# Patient Record
Sex: Female | Born: 1960 | Marital: Married | Smoking: Former smoker
Health system: Southern US, Community
[De-identification: ages and names within clinical notes are randomized; demographics above are authoritative.]

---

## 2018-02-04 ENCOUNTER — Other Ambulatory Visit: Payer: Self-pay | Admitting: Family Medicine

## 2018-02-04 DIAGNOSIS — Z1231 Encounter for screening mammogram for malignant neoplasm of breast: Secondary | ICD-10-CM

## 2018-03-18 ENCOUNTER — Ambulatory Visit: Payer: Self-pay

## 2018-03-31 HISTORY — PX: WISDOM TOOTH EXTRACTION: SHX21

## 2018-06-02 ENCOUNTER — Telehealth: Payer: Self-pay

## 2018-06-02 NOTE — Telephone Encounter (Signed)
Notes on file rj

## 2018-06-04 ENCOUNTER — Ambulatory Visit (INDEPENDENT_AMBULATORY_CARE_PROVIDER_SITE_OTHER): Payer: 59 | Admitting: Cardiology

## 2018-06-04 ENCOUNTER — Encounter: Payer: Self-pay | Admitting: Cardiology

## 2018-06-04 VITALS — BP 120/66 | HR 73 | Ht 63.0 in | Wt 142.0 lb

## 2018-06-04 DIAGNOSIS — R9431 Abnormal electrocardiogram [ECG] [EKG]: Secondary | ICD-10-CM

## 2018-06-04 DIAGNOSIS — R0789 Other chest pain: Secondary | ICD-10-CM | POA: Diagnosis not present

## 2018-06-04 DIAGNOSIS — Z87891 Personal history of nicotine dependence: Secondary | ICD-10-CM

## 2018-06-04 DIAGNOSIS — R079 Chest pain, unspecified: Secondary | ICD-10-CM

## 2018-06-04 NOTE — Progress Notes (Signed)
Cardiology Office Note:    Date:  06/04/2018   ID:  Laurie Atkins, DOB [birth year], MRN [id number]  PCP:  [facility]  Cardiologist:  Garwin Brothers, MD   Referring MD: Richmond Campbell., PA-C    ASSESSMENT:    1. Abnormal electrocardiogram (ECG) (EKG)   2. Chest discomfort   3. Ex-smoker    PLAN:    In order of problems listed above:  1. Primary prevention stressed with the patient.  Importance of compliance with diet and medication stressed and she vocalized understanding.  Her blood pressure is stable.  Diet was discussed.  She is planning to undergo abdominal surgery also in view of multiple risk factors and sedentary lifestyle she will undergo exercise stress echo.  If this is negative then she is not at high risk for coronary events during the aforementioned surgery.  Meticulous hemodynamic monitoring will further reduce the risk of coronary events. 2. Patient will be seen in follow-up appointment in 4 months or earlier if the patient has any concerns    Medication Adjustments/Labs and Tests Ordered: Current medicines are reviewed at length with the patient today.  Concerns regarding medicines are outlined above.  No orders of the defined types were placed in this encounter.  No orders of the defined types were placed in this encounter.    History of Present Illness:    Laurie Atkins is a 58 y.o. female who is being seen today for the evaluation of abnormal EKG and chest discomfort at the request of Richmond Campbell., PA-C.  Patient is a pleasant 58 year old female.  She has no significant past medical history.  She tells me that the last time she had blood work her sugars were mildly elevated and she was told that it could be taken care of with diet.  She has smoked significantly but quit a few years ago.  She was found to have abnormal EKG and sent here for evaluation.  She occasionally has chest discomfort this is more of a knifelike  sensation.  No radiation to the neck or to the arms.  No orthopnea or PND.  She does not exercise on a regular basis and leads a sedentary lifestyle.  At the time of my evaluation, the patient is alert awake oriented and in no distress.  History reviewed. No pertinent past medical history.  Past Surgical History:  Procedure Laterality Date  . WISDOM TOOTH EXTRACTION  2020    Current Medications: Current Meds  Medication Sig  . diphenhydrAMINE (BENADRYL) 25 MG tablet Take 25 mg by mouth every 6 (six) hours as needed.     Allergies:   Patient has no allergy information on record.   Social History   Socioeconomic History  . Marital status: Unknown    Spouse name: Not on file  . Number of children: Not on file  . Years of education: Not on file  . Highest education level: Not on file  Occupational History  . Not on file  Social Needs  . Financial resource strain: Not on file  . Food insecurity:    Worry: Not on file    Inability: Not on file  . Transportation needs:    Medical: Not on file    Non-medical: Not on file  Tobacco Use  . Smoking status: Former Games developer  . Smokeless tobacco: Never Used  Substance and Sexual Activity  . Alcohol use: Not on file  . Drug use: Not on file  . Sexual  activity: Not on file  Lifestyle  . Physical activity:    Days per week: Not on file    Minutes per session: Not on file  . Stress: Not on file  Relationships  . Social connections:    Talks on phone: Not on file    Gets together: Not on file    Attends religious service: Not on file    Active member of club or organization: Not on file    Attends meetings of clubs or organizations: Not on file    Relationship status: Not on file  Other Topics Concern  . Not on file  Social History Narrative  . Not on file     Family History: The patient's family history includes Arrhythmia in her mother; Heart Problems in her father.  ROS:   Please see the history of present illness.      All other systems reviewed and are negative.  EKGs/Labs/Other Studies Reviewed:    The following studies were reviewed today: I discussed my findings with the EKG revealing sinus rhythm with nonspecific ST-T changes. LAE.   Recent Labs: No results found for requested labs within last 8760 hours.  Recent Lipid Panel No results found for: CHOL, TRIG, HDL, CHOLHDL, VLDL, LDLCALC, LDLDIRECT  Physical Exam:    VS:  BP 120/66 (BP Location: Left Arm, Patient Position: Sitting, Cuff Size: Normal)   Pulse 73   Ht 5\' 3"  (1.6 m)   Wt 142 lb (64.4 kg)   SpO2 98%   BMI 25.15 kg/m     Wt Readings from Last 3 Encounters:  06/04/18 142 lb (64.4 kg)     GEN: Patient is in no acute distress HEENT: Normal NECK: No JVD; No carotid bruits LYMPHATICS: No lymphadenopathy CARDIAC: S1 S2 regular, 2/6 systolic murmur at the apex. RESPIRATORY:  Clear to auscultation without rales, wheezing or rhonchi  ABDOMEN: Soft, non-tender, non-distended MUSCULOSKELETAL:  No edema; No deformity  SKIN: Warm and dry NEUROLOGIC:  Alert and oriented x 3 PSYCHIATRIC:  Normal affect    Signed, Garwin Brothers, MD  06/04/2018 9:59 AM    [facility]

## 2018-06-04 NOTE — Patient Instructions (Addendum)
Medication Instructions:  Your physician recommends that you continue on your current medications as directed. Please refer to the Current Medication list given to you today.  If you need a refill on your cardiac medications before your next appointment, please call your pharmacy.   Lab work: None. If you have labs (blood work) drawn today and your tests are completely normal, you will receive your results only by: Marland Kitchen MyChart Message (if you have MyChart) OR . A paper copy in the mail If you have any lab test that is abnormal or we need to change your treatment, we will call you to review the results.  Testing/Procedures: Your physician has requested that you have a stress echocardiogram. For further information please visit [email or web address] Please follow instruction sheet as given.     Follow-Up: At [facility], you and your health needs are our priority.  As part of our continuing mission to provide you with exceptional heart care, we have created designated Provider Care Teams.  These Care Teams include your primary Cardiologist (physician) and Advanced Practice Providers (APPs -  Physician Assistants and Nurse Practitioners) who all work together to provide you with the care you need, when you need it. You will need a follow up appointment in 4 months.  Please call our office 2 months in advance to schedule this appointment.  You may see No primary care provider on file. or another member of our [employer] in [place]: Gypsy Balsam, MD . Norman Herrlich, MD  Any Other Special Instructions Will Be Listed Below (If Applicable).    Exercise Stress Echocardiogram  An exercise stress echocardiogram is a test to check how well your heart is working. This test uses sound waves (ultrasound) and a computer to make images of your heart before and after exercise. Ultrasound images that are taken before you exercise (your resting echocardiogram) will show how much  blood is getting to your heart muscle and how well your heart muscle and heart valves are functioning. During the next part of this test, you will walk on a treadmill or ride a stationary bike to see how exercise affects your heart. While you exercise, the electrical activity of your heart will be monitored with an electrocardiogram (ECG). Your blood pressure will also be monitored. You may have this test if you:  Have chest pain or other symptoms of a heart problem.  Recently had a heart attack or heart surgery.  Have heart valve problems.  Have a condition that causes narrowing of the blood vessels that supply your heart (coronary artery disease).  Have a high risk of heart disease and are starting a new exercise program.  Have a high risk of heart disease and need to have major surgery. Tell a health care provider about:  Any allergies you have.  All medicines you are taking, including vitamins, herbs, eye drops, creams, and over-the-counter medicines.  Any problems you or family members have had with anesthetic medicines.  Any blood disorders you have.  Any surgeries you have had.  Any medical conditions you have.  Whether you are pregnant or may be pregnant. What are the risks? Generally, this is a safe procedure. However, problems may occur, including:  Chest pain.  Dizziness or light-headedness.  Shortness of breath.  Increased or irregular heartbeat (palpitations).  Nausea or vomiting.  Heart attack (very rare). What happens before the procedure?  Follow instructions from your health care provider about eating or drinking restrictions. You may be asked  to avoid all forms of caffeine for 24 hours before your procedure, or as told by your health care provider.  Ask your health care provider about changing or stopping your regular medicines. This is especially important if you are taking diabetes medicines or blood thinners.  If you use an inhaler, bring it with  you to the test.  Wear loose, comfortable clothing and walking shoes.  Do notuse any products that contain nicotine or tobacco, such as cigarettes and e-cigarettes, for 4 hours before the test or as told by your health care provider. If you need help quitting, ask your health care provider. What happens during the procedure?  You will take off your clothes from the waist up and put on a hospital gown.  A technician will place electrodes on your chest.  A blood pressure cuff will be placed on your arm.  You will lie down on a table for an ultrasound exam before you exercise. Gel will be rubbed on your chest, and a handheld device (transducer) will be pressed against your chest and moved over your heart.  Then, you will start exercising by walking on a treadmill or pedaling a stationary bicycle.  Your blood pressure and heart rhythm will be monitored while you exercise.  The exercise will gradually get harder or faster.  You will exercise until: ? Your heart reaches a target level. ? You are too tired to continue. ? You cannot continue because of chest pain, weakness, or dizziness.  You will have another ultrasound exam after you stop exercising. The procedure may vary among health care providers and hospitals. What happens after the procedure?  Your heart rate and blood pressure will be monitored until they return to your normal levels. Summary  An exercise stress echocardiogram is a test that uses ultrasound to check how well your heart works before and after exercise.  Before the test, follow instructions from your health care provider about stopping medications, avoiding nicotine and tobacco, and avoiding certain foods and drinks.  During the test, your blood pressure and heart rhythm will be monitored while you exercise on a treadmill or stationary bicycle. This information is not intended to replace advice given to you by your health care provider. Make sure you discuss any  questions you have with your health care provider. Document Released: 03/21/2004 Document Revised: 11/07/2015 Document Reviewed: 11/07/2015 Elsevier Interactive Patient Education  2019 [employer].

## 2018-06-16 ENCOUNTER — Telehealth: Payer: Self-pay | Admitting: Cardiology

## 2018-06-16 NOTE — Telephone Encounter (Signed)
Patient had to cancel her Echo scheduled for Friday because her out of pocket expense was going to exceed $1000

## 2018-06-18 ENCOUNTER — Ambulatory Visit (HOSPITAL_BASED_OUTPATIENT_CLINIC_OR_DEPARTMENT_OTHER): Payer: 59

## 2018-08-26 ENCOUNTER — Other Ambulatory Visit: Payer: Self-pay

## 2018-08-26 ENCOUNTER — Telehealth: Payer: Self-pay | Admitting: Cardiology

## 2018-08-26 NOTE — Addendum Note (Signed)
Addended by: Pamala Hurry on: 08/26/2018 04:47 PM   Modules accepted: Orders

## 2018-08-26 NOTE — Telephone Encounter (Signed)
yes

## 2018-08-26 NOTE — Telephone Encounter (Signed)
Called patient to discuss lexi procedure. Printed info and forwarded to patient via usps.

## 2018-08-26 NOTE — Telephone Encounter (Signed)
Patient called and wants to have stress echo as soon as she can.. she is nervous and states that she is wanting to have done soone that later and maybe we can schedule Myoview in our office, she is still having some chest pain.. she is nervous about it.Laurie Atkins

## 2018-08-30 ENCOUNTER — Telehealth: Payer: Self-pay

## 2018-08-30 NOTE — Telephone Encounter (Signed)
Patient requested billing code information for lexiscan, code is [phone]. Was unable to leave message on patient cell or home vm.

## 2018-08-31 ENCOUNTER — Telehealth: Payer: Self-pay | Admitting: *Deleted

## 2018-08-31 NOTE — Telephone Encounter (Signed)
Patient given detailed instructions per Myocardial Perfusion Study Information Sheet for the test on 09/02/18. Patient notified to arrive 15 minutes early and that it is imperative to arrive on time for appointment to keep from having the test rescheduled.  If you need to cancel or reschedule your appointment, please call the office within 24 hours of your appointment. . Patient verbalized understanding. Ricky Ala, RN

## 2018-09-02 ENCOUNTER — Ambulatory Visit (INDEPENDENT_AMBULATORY_CARE_PROVIDER_SITE_OTHER): Payer: 59

## 2018-09-02 DIAGNOSIS — R079 Chest pain, unspecified: Secondary | ICD-10-CM

## 2018-09-02 MED ORDER — REGADENOSON 0.4 MG/5ML IV SOLN
0.4000 mg | Freq: Once | INTRAVENOUS | Status: AC
  Administered 2018-09-02: 0.4 mg via INTRAVENOUS

## 2018-09-02 MED ORDER — TECHNETIUM TC 99M TETROFOSMIN IV KIT
32.1000 | PACK | Freq: Once | INTRAVENOUS | Status: AC | PRN
  Administered 2018-09-02: 32.1 via INTRAVENOUS

## 2018-09-02 MED ORDER — TECHNETIUM TC 99M TETROFOSMIN IV KIT
10.2000 | PACK | Freq: Once | INTRAVENOUS | Status: AC | PRN
  Administered 2018-09-02: 10.2 via INTRAVENOUS

## 2018-09-08 LAB — MYOCARDIAL PERFUSION IMAGING
LV dias vol: 105 mL (ref 46–106)
LV sys vol: 48 mL
Peak HR: 111 {beats}/min
Rest HR: 61 {beats}/min
SDS: 4
SRS: 7
SSS: 11
TID: 1.04

## 2018-09-13 ENCOUNTER — Telehealth: Payer: Self-pay

## 2018-09-13 ENCOUNTER — Encounter: Payer: Self-pay | Admitting: Cardiology

## 2018-09-13 ENCOUNTER — Other Ambulatory Visit: Payer: Self-pay

## 2018-09-13 ENCOUNTER — Telehealth (INDEPENDENT_AMBULATORY_CARE_PROVIDER_SITE_OTHER): Payer: 59 | Admitting: Cardiology

## 2018-09-13 VITALS — Ht 63.0 in | Wt 135.4 lb

## 2018-09-13 DIAGNOSIS — I209 Angina pectoris, unspecified: Secondary | ICD-10-CM

## 2018-09-13 DIAGNOSIS — R0789 Other chest pain: Secondary | ICD-10-CM | POA: Diagnosis not present

## 2018-09-13 DIAGNOSIS — Z87891 Personal history of nicotine dependence: Secondary | ICD-10-CM

## 2018-09-13 MED ORDER — METOPROLOL TARTRATE 50 MG PO TABS: 100.0000 mg | ORAL_TABLET | Freq: Two times a day (BID) | ORAL | 0 refills | Status: DC

## 2018-09-13 NOTE — Patient Instructions (Signed)
Medication Instructions:   If you need a refill on your cardiac medications before your next appointment, please call your pharmacy.   Lab work: You will need to come in 7 days prior to your procedure for a BMP to be drawn.  If you have labs (blood work) drawn today and your tests are completely normal, you will receive your results only by:  MyChart Message (if you have MyChart) OR  A paper copy in the mail If you have any lab test that is abnormal or we need to change your treatment, we will call you to review the results.  Testing/Procedures: Non-Cardiac CT scanning, (CAT scanning), is a noninvasive, special x-ray that produces cross-sectional images of the body using x-rays and a computer. CT scans help physicians diagnose and treat medical conditions. For some CT exams, a contrast material is used to enhance visibility in the area of the body being studied. CT scans provide greater clarity and reveal more details than regular x-ray exams.  Please [facility] the [facility] main entrance of [facility] at xx:xx AM (30-45 minutes prior to test start time)  [facility] [address] [place], [place] [address] [phone]  Proceed to the [facility] Radiology Department (First Floor).  Please follow these instructions carefully (unless otherwise directed):  On the Night Before the Test:  Be sure to Drink plenty of water.  Do not consume any caffeinated/decaffeinated beverages or chocolate 12 hours prior to your test.  Do not take any antihistamines 12 hours prior to your test.  On the Day of the Test:  Drink plenty of water. Do not drink any water within one hour of the test.  Do not eat any food 4 hours prior to the test.  You may take your regular medications prior to the test.   Take metoprolol (Lopressor) IF HR is greater than 55 BPM two hours prior to test.      After the Test:  Drink plenty of water.  After receiving IV contrast, you may  experience a mild flushed feeling. This is normal.  On occasion, you may experience a mild rash up to 24 hours after the test. This is not dangerous. If this occurs, you can take Benadryl 25 mg and increase your fluid intake.  If you experience trouble breathing, this can be serious. If it is severe call 911 IMMEDIATELY. If it is mild, please call our office.    Follow-Up: At [facility], you and your health needs are our priority.  As part of our continuing mission to provide you with exceptional heart care, we have created designated Provider Care Teams.  These Care Teams include your primary Cardiologist (physician) and Advanced Practice Providers (APPs -  Physician Assistants and Nurse Practitioners) who all work together to provide you with the care you need, when you need it. You will need a follow up appointment in 4 months.    Any Other Special Instructions Will Be Listed Below  Metoprolol tablets What is this medicine? METOPROLOL (me TOE proe lole) is a beta-blocker. Beta-blockers reduce the workload on the heart and help it to beat more regularly. This medicine is used to treat high blood pressure and to prevent chest pain. It is also used to after a heart attack and to prevent an additional heart attack from occurring. This medicine may be used for other purposes; ask your health care provider or pharmacist if you have questions. COMMON BRAND NAME(S): Lopressor What should I tell  my health care provider before I take this medicine? They need to know if you have any of these conditions: -diabetes -heart or vessel disease like slow heart rate, worsening heart failure, heart block, sick sinus syndrome or Raynaud's disease -kidney disease -liver disease -lung or breathing disease, like asthma or emphysema -pheochromocytoma -thyroid disease -an unusual or allergic reaction to metoprolol, other beta-blockers, medicines, foods, dyes, or preservatives -pregnant or trying to get  pregnant -breast-feeding How should I use this medicine? Take this medicine by mouth with a drink of water. Follow the directions on the prescription label. Take this medicine immediately after meals. Take your doses at regular intervals. Do not take more medicine than directed. Do not stop taking this medicine suddenly. This could lead to serious heart-related effects. Talk to your pediatrician regarding the use of this medicine in children. Special care may be needed. Overdosage: If you think you have taken too much of this medicine contact a poison control center or emergency room at once. NOTE: This medicine is only for you. Do not share this medicine with others. What if I miss a dose? If you miss a dose, take it as soon as you can. If it is almost time for your next dose, take only that dose. Do not take double or extra doses. What may interact with this medicine? This medicine may interact with the following medications: -certain medicines for blood pressure, heart disease, irregular heart beat -certain medicines for depression like monoamine oxidase (MAO) inhibitors, fluoxetine, or paroxetine -clonidine -dobutamine -epinephrine -isoproterenol -reserpine This list may not describe all possible interactions. Give your health care provider a list of all the medicines, herbs, non-prescription drugs, or dietary supplements you use. Also tell them if you smoke, drink alcohol, or use illegal drugs. Some items may interact with your medicine. What should I watch for while using this medicine? Visit your doctor or health care professional for regular check ups. Contact your doctor right away if your symptoms worsen. Check your blood pressure and pulse rate regularly. Ask your health care professional what your blood pressure and pulse rate should be, and when you should contact them. You may get drowsy or dizzy. Do not drive, use machinery, or do anything that needs mental alertness until you  know how this medicine affects you. Do not sit or stand up quickly, especially if you are an older patient. This reduces the risk of dizzy or fainting spells. Contact your doctor if these symptoms continue. Alcohol may interfere with the effect of this medicine. Avoid alcoholic drinks. What side effects may I notice from receiving this medicine? Side effects that you should report to your doctor or health care professional as soon as possible: -allergic reactions like skin rash, itching or hives -cold or numb hands or feet -depression -difficulty breathing -faint -fever with sore throat -irregular heartbeat, chest pain -rapid weight gain -swollen legs or ankles Side effects that usually do not require medical attention (report to your doctor or health care professional if they continue or are bothersome): -anxiety or nervousness -change in sex drive or performance -dry skin -headache -nightmares or trouble sleeping -short term memory loss -stomach upset or diarrhea -unusually tired This list may not describe all possible side effects. Call your doctor for medical advice about side effects. You may report side effects to FDA at 1-800-FDA-1088. Where should I keep my medicine? Keep out of the reach of children. Store at room temperature between 15 and 30 degrees C (59 and 86 degrees  F). Throw away any unused medicine after the expiration date. NOTE: This sheet is a summary. It may not cover all possible information. If you have questions about this medicine, talk to your doctor, pharmacist, or health care provider.  2019 Elsevier/Gold Standard (2012-11-19 14:40:36)   Coronary Angiogram A coronary angiogram is an X-ray procedure that is used to examine the arteries in the heart. In this procedure, a dye (contrast dye) is injected through a long, thin tube (catheter). The catheter is inserted through the groin, wrist, or arm. The dye is injected into each artery, then X-rays are taken to  show if there is a blockage in the arteries of the heart. This procedure can also show if you have valve disease or a disease of the aorta, and it can be used to check the overall function of your heart muscle. You may have a coronary angiogram if:  You are having chest pain, or other symptoms of angina, and you are at risk for heart disease.  You have an abnormal electrocardiogram (ECG) or stress test.  You have chest pain and heart failure.  You are having irregular heart rhythms.  You and your health care provider determine that the benefits of the test information outweigh the risks of the procedure. Let your health care provider know about:  Any allergies you have, including allergies to contrast dye.  All medicines you are taking, including vitamins, herbs, eye drops, creams, and over-the-counter medicines.  Any problems you or family members have had with anesthetic medicines.  Any blood disorders you have.  Any surgeries you have had.  History of kidney problems or kidney failure.  Any medical conditions you have.  Whether you are pregnant or may be pregnant. What are the risks? Generally, this is a safe procedure. However, problems may occur, including:  Infection.  Allergic reaction to medicines or dyes that are used.  Bleeding from the access site or other locations.  Kidney injury, especially in people with impaired kidney function.  Stroke (rare).  Heart attack (rare).  Damage to other structures or organs. What happens before the procedure? Staying hydrated Follow instructions from your health care provider about hydration, which may include:  Up to 2 hours before the procedure - you may continue to drink clear liquids, such as water, clear fruit juice, black coffee, and plain tea. Eating and drinking restrictions Follow instructions from your health care provider about eating and drinking, which may include:  8 hours before the procedure - stop  eating heavy meals or foods such as meat, fried foods, or fatty foods.  6 hours before the procedure - stop eating light meals or foods, such as toast or cereal.  2 hours before the procedure - stop drinking clear liquids. General instructions  Ask your health care provider about: ? Changing or stopping your regular medicines. This is especially important if you are taking diabetes medicines or blood thinners. ? Taking medicines such as ibuprofen. These medicines can thin your blood. Do not take these medicines before your procedure if your health care provider instructs you not to, though aspirin may be recommended prior to coronary angiograms.  Plan to have someone take you home from the hospital or clinic.  You may need to have blood tests or X-rays done. What happens during the procedure?  An IV tube will be inserted into one of your veins.  You will be given one or more of the following: ? A medicine to help you relax (sedative). ?  A medicine to numb the area where the catheter will be inserted into an artery (local anesthetic).  To reduce your risk of infection: ? Your health care team will wash or sanitize their hands. ? Your skin will be washed with soap. ? Hair may be removed from the area where the catheter will be inserted.  You will be connected to a continuous ECG monitor.  The catheter will be inserted into an artery. The location may be in your groin, in your wrist, or in the fold of your arm (near your elbow).  A type of X-ray (fluoroscopy) will be used to help guide the catheter to the opening of the blood vessel that is being examined.  A dye will be injected into the catheter, and X-rays will be taken. The dye will help to show where any narrowing or blockages are located in the heart arteries.  Tell your health care provider if you have any chest pain or trouble breathing during the procedure.  If blockages are found, your health care provider may perform  another procedure, such as inserting a coronary stent. The procedure may vary among health care providers and hospitals. What happens after the procedure?  After the procedure, you will need to keep the area still for a few hours, or for as long as told by your health care provider. If the procedure is done through the groin, you will be instructed to not bend and not cross your legs.  The insertion site will be checked frequently.  The pulse in your foot or wrist will be checked frequently.  You may have additional blood tests, X-rays, and a test that records the electrical activity of your heart (ECG).  Do not drive for 24 hours if you were given a sedative. Summary  A coronary angiogram is an X-ray procedure that is used to look into the arteries in the heart.  During the procedure, a dye (contrast dye) is injected through a long, thin tube (catheter). The catheter is inserted through the groin, wrist, or arm.  Tell your health care provider about any allergies you have, including allergies to contrast dye.  After the procedure, you will need to keep the area still for a few hours, or for as long as told by your health care provider. This information is not intended to replace advice given to you by your health care provider. Make sure you discuss any questions you have with your health care provider. Document Released: 09/21/2002 Document Revised: 12/28/2015 Document Reviewed: 12/28/2015 Elsevier Interactive Patient Education  2019 [employer]

## 2018-09-13 NOTE — Progress Notes (Signed)
Virtual Visit via Video Note   This visit type was conducted due to national recommendations for restrictions regarding the COVID-19 Pandemic (e.g. social distancing) in an effort to limit this patient's exposure and mitigate transmission in our community.  Due to her co-morbid illnesses, this patient is at least at moderate risk for complications without adequate follow up.  This format is felt to be most appropriate for this patient at this time.  All issues noted in this document were discussed and addressed.  A limited physical exam was performed with this format.  Please refer to the patient's chart for her consent to telehealth for [facility].   Date:  09/13/2018   ID:  Laurie Atkins, DOB 1960/07/27, MRN [id number]  Patient Location: Home Provider Location: Home  PCP:  [place], [facility]  Cardiologist:  No primary care provider on file.  Electrophysiologist:  None   Evaluation Performed:  Follow-Up Visit  Chief Complaint: Angina pectoris  History of Present Illness:    Laurie SowSharon Boberg is a 58 y.o. female with past medical history of smoking.  The patient mentions to me that she experiences chest tightness at times.  This is not related to exertion.  She is very concerned about it.  She underwent stress testing which revealed evidence of ischemia and was abnormal.  This was a low risk scan with minimal amount of ischemia.  At the time of my evaluation, the patient is alert awake oriented and in no distress.  Patient also mentions to me that she walks about 20 to 25 minutes at times walking her dog.  The patient does not have symptoms concerning for COVID-19 infection (fever, chills, cough, or new shortness of breath).    History reviewed. No pertinent past medical history. Past Surgical History:  Procedure Laterality Date  . WISDOM TOOTH EXTRACTION  2020     Current Meds  Medication Sig  . aspirin EC 81 MG tablet Take 81 mg by mouth daily.  .  diphenhydrAMINE (BENADRYL) 25 MG tablet Take 25 mg by mouth at bedtime.   . Multiple Vitamin (MULTIVITAMIN) capsule Take 1 capsule by mouth daily.     Allergies:   Patient has no known allergies.   Social History   Tobacco Use  . Smoking status: Former Games developermoker  . Smokeless tobacco: Never Used  Substance Use Topics  . Alcohol use: Not on file  . Drug use: Not on file     Family Hx: The patient's family history includes Arrhythmia in her mother; Heart Problems in her father.  ROS:   Please see the history of present illness.    As mentioned above All other systems reviewed and are negative.   Prior CV studies:   The following studies were reviewed today:  Study Highlights   The left ventricular ejection fraction is mildly decreased (45-54%).  Nuclear stress EF: 54%.  Blood pressure demonstrated a normal response to exercise.  There was no ST segment deviation noted during stress.  Defect 1: There is a small defect of mild severity present in the apical septal location.  Findings consistent with ischemia.  This is a low risk study.  Small area of ischemia onvolving apical portion of the septum.       Labs/Other Tests and Data Reviewed:    EKG:  No ECG reviewed.  Recent Labs: No results found for requested labs within last 8760 hours.   Recent Lipid Panel No results found for: CHOL, TRIG, HDL, CHOLHDL, LDLCALC, LDLDIRECT  Wt Readings from Last 3 Encounters:  09/13/18 135 lb 6.4 oz (61.4 kg)  09/02/18 142 lb (64.4 kg)  06/04/18 142 lb (64.4 kg)     Objective:    Vital Signs:  Ht 5\' 3"  (1.6 m)   Wt 135 lb 6.4 oz (61.4 kg)   BMI 23.99 kg/m    VITAL SIGNS:  reviewed  ASSESSMENT & PLAN:    1. Angina pectoris: I discussed my findings with the patient at extensive length.  Sublingual nitroglycerin prescription was sent, its protocol and 911 protocol explained and the patient vocalized understanding questions were answered to the patient's satisfaction.   Her symptoms have mixed features.  In view of risk factors for coronary artery disease I will obtain a CT coronary angiography.  Procedure, benefits and potential risks explained and she vocalized understanding. 2. She chews Nicorette gum and she was advised to discontinue that. 3. She is taking a coated 81 mg aspirin on a daily basis.  I told her to continue this till the results of her tests come back.  She knows to go to the nearest emergency room for any concerning symptoms. 4. Follow-up appointment in a month or earlier if she has any concerns.  COVID-19 Education: The signs and symptoms of COVID-19 were discussed with the patient and how to seek care for testing (follow up with PCP or arrange E-visit).  The importance of social distancing was discussed today.  Time:   Today, I have spent 15 minutes with the patient with telehealth technology discussing the above problems.     Medication Adjustments/Labs and Tests Ordered: Current medicines are reviewed at length with the patient today.  Concerns regarding medicines are outlined above.   Tests Ordered: No orders of the defined types were placed in this encounter.   Medication Changes: No orders of the defined types were placed in this encounter.   Follow Up:  Virtual Visit or In Person in 1 month(s)  Signed, Jenean Lindau, MD  09/13/2018 3:34 PM    [place]

## 2018-09-13 NOTE — Telephone Encounter (Signed)
-----   Message from Jenean Lindau, MD sent at 09/08/2018 11:42 AM EDT ----- Elective appointment.  Virtual is fine.  I can see her next week. Jenean Lindau, MD 09/08/2018 11:42 AM

## 2018-09-13 NOTE — Addendum Note (Signed)
Addended by: Beckey Rutter on: 09/13/2018 04:49 PM   Modules accepted: Orders

## 2018-09-13 NOTE — Telephone Encounter (Signed)
Patient ok with virtual visit scheduled 09/13/18

## 2018-09-23 LAB — BASIC METABOLIC PANEL
BUN/Creatinine Ratio: 20 (ref 9–23)
BUN: 12 mg/dL (ref 6–24)
CO2: 25 mmol/L (ref 20–29)
Calcium: 9.4 mg/dL (ref 8.7–10.2)
Chloride: 102 mmol/L (ref 96–106)
Creatinine, Ser: 0.59 mg/dL (ref 0.57–1.00)
GFR calc Af Amer: 118 mL/min/{1.73_m2} (ref >59)
GFR calc non Af Amer: 102 mL/min/{1.73_m2} (ref >59)
Glucose: 91 mg/dL (ref 65–99)
Potassium: 4.2 mmol/L (ref 3.5–5.2)
Sodium: 141 mmol/L (ref 134–144)

## 2018-09-24 ENCOUNTER — Telehealth: Payer: Self-pay

## 2018-09-24 ENCOUNTER — Telehealth (HOSPITAL_COMMUNITY): Payer: Self-pay | Admitting: Emergency Medicine

## 2018-09-24 NOTE — Telephone Encounter (Signed)
Reaching out to patient to offer assistance regarding upcoming cardiac imaging study; pt verbalizes understanding of appt date/time, parking situation and where to check in, pre-test NPO status and medications ordered, and verified current allergies; name and call back number provided for further questions should they arise Osie Amparo RN Navigator Cardiac Imaging [facility] [phone] office [phone] cell  Pt denies covid symptoms, verbalized understanding of visitor policy. 

## 2018-09-27 ENCOUNTER — Ambulatory Visit (HOSPITAL_COMMUNITY): Payer: 59

## 2018-09-27 ENCOUNTER — Ambulatory Visit (HOSPITAL_COMMUNITY)
Admission: RE | Admit: 2018-09-27 | Discharge: 2018-09-27 | Disposition: A | Discharge status: Home / Self Care | Payer: 59 | Source: Ambulatory Visit | Attending: Cardiology | Admitting: Cardiology

## 2018-09-27 ENCOUNTER — Other Ambulatory Visit: Payer: Self-pay

## 2018-09-27 ENCOUNTER — Encounter (HOSPITAL_COMMUNITY): Payer: Self-pay

## 2018-09-27 DIAGNOSIS — I209 Angina pectoris, unspecified: Secondary | ICD-10-CM | POA: Insufficient documentation

## 2018-09-27 MED ORDER — NITROGLYCERIN 0.4 MG SL SUBL
0.8000 mg | SUBLINGUAL_TABLET | Freq: Once | SUBLINGUAL | Status: AC
  Administered 2018-09-27: 0.8 mg via SUBLINGUAL

## 2018-09-27 MED ORDER — NITROGLYCERIN 0.4 MG SL SUBL
SUBLINGUAL_TABLET | SUBLINGUAL | Status: AC
  Administered 2018-09-27: 0.8 mg via SUBLINGUAL
  Filled 2018-09-27: qty 2

## 2018-09-27 MED ORDER — NITROGLYCERIN 0.4 MG SL SUBL: 0.4000 mg | SUBLINGUAL_TABLET | SUBLINGUAL | Status: DC | PRN

## 2018-09-27 MED ORDER — IOHEXOL 350 MG/ML SOLN
80.0000 mL | Freq: Once | INTRAVENOUS | Status: AC | PRN
  Administered 2018-09-27: 13:00:00 100 mL via INTRAVENOUS

## 2018-09-27 NOTE — Progress Notes (Signed)
Notified Dr. Aundra Dubin of IV contrast administration amount.  No new orders at this time.  Will cont to monitor

## 2018-09-27 NOTE — Progress Notes (Signed)
Pt tolerated exam without incident.  PIV removed and dressing applied.  Pt provided with caffeinated beverage and crackers.  Discharge instructions discussed with patient and written instructions given to patient.  Pt discharged

## 2018-09-27 NOTE — Discharge Instructions (Signed)
Testing With IV Contrast Material °IV contrast material is a fluid that is used with some imaging tests. It is injected into your body through a vein. Contrast material is used when your health care providers need a detailed look at organs, tissues, or blood vessels that may not show up with the standard test. The material may be used when an X-ray, an MRI, a CT scan, or an ultrasound is done. °IV contrast material may be used for imaging tests that check: °· Muscles, skin, and fat. °· Breasts. °· Brain. °· Digestive tract. °· Heart. °· Organs such as the liver, kidneys, lungs, bladder, and many others. °· Arteries and veins. °Tell a health care provider about: °· Any allergies you have, especially an allergy to contrast material. °· All medicines you are taking, including metformin, beta blockers, NSAIDs (such as ibuprofen), interleukin-2, vitamins, herbs, eye drops, creams, and over-the-counter medicines. °· Any problems you or family members have had with the use of contrast material. °· Any blood disorders you have, such as sickle cell anemia. °· Any surgeries you have had. °· Any medical conditions you have or have had, especially alcohol abuse, dehydration, asthma, or kidney, liver, or heart problems. °· Whether you are pregnant or may be pregnant. °· Whether you are breastfeeding. Most contrast materials are safe for use in breastfeeding women. °What are the risks? °Generally, this is a safe procedure. However, problems may occur, including: °· Headache. °· Itching, skin rash, and hives. °· Nausea and vomiting. °· Allergic reactions. °· Wheezing or difficulty breathing. °· Abnormal heart rate. °· Changes in blood pressure. °· Throat swelling. °· Kidney damage. °What happens before the procedure? °Medicines °Ask your health care provider about: °· Changing or stopping your regular medicines. This is especially important if you are taking diabetes medicines or blood thinners. °· Taking medicines such as aspirin  and ibuprofen. These medicines can thin your blood. Do not take these medicines unless your health care provider tells you to take them. °· Taking over-the-counter medicines, vitamins, herbs, and supplements. °If you are at risk of having a reaction to the IV contrast material, you may be asked to take medicine before the procedure to prevent a reaction. °General instructions °· Follow instructions from your health care provider about eating or drinking restrictions. °· You may have an exam or lab tests to make sure that you can safely get IV contrast material. °· Ask if you will be given a medicine to help you relax (sedative) during the procedure. If so, plan to have someone take you home from the hospital or clinic. °What happens during the procedure? °· You may be given a sedative to help you relax. °· An IV will be inserted into one of your veins. °· Contrast material will be injected into your IV. °· You may feel warmth or flushing as the contrast material enters your bloodstream. °· You may have a metallic taste in your mouth for a few minutes. °· The needle may cause some discomfort and bruising. °· After the contrast material is in your body, the imaging test will be done. °The procedure may vary among health care providers and hospitals. °What can I expect after the procedure? °· The IV will be removed. °· You may be taken to a recovery area if sedation medicines were used. Your blood pressure, heart rate, breathing rate, and blood oxygen level will be monitored until you leave the hospital or clinic. °Follow these instructions at home: ° °· Take over-the-counter and   prescription medicines only as told by your health care provider. °? Your health care provider may tell you to not take certain medicines for a couple of days after the procedure. This is especially important if you are taking diabetes medicines. °· If you are told, drink enough fluid to keep your urine pale yellow. This will help to remove  the contrast material out of your body. °· Do not drive for 24 hours if you were given a sedative during your procedure. °· It is up to you to get the results of your procedure. Ask your health care provider, or the department that is doing the procedure, when your results will be ready. °· Keep all follow-up visits as told by your health care provider. This is important. °Contact a health care provider if: °· You have redness, swelling, or pain near your IV site. °Get help right away if: °· You have an abnormal heart rhythm. °· You have trouble breathing. °· You have: °? Chest pain. °? Pain in your back, neck, arm, jaw, or stomach. °? Nausea or sweating. °? Hives or a rash. °· You start shaking and cannot stop. °These symptoms may represent a serious problem that is an emergency. Do not wait to see if the symptoms will go away. Get medical help right away. Call your local emergency services (911 in the [place] Do not drive yourself to the hospital. °Summary °· IV contrast material may be used for imaging tests to help your health care providers see your organs and tissues more clearly. °· Tell your health care provider if you are pregnant or may be pregnant. °· During the procedure, you may feel warmth or flushing as the contrast material enters your bloodstream. °· After the procedure, drink enough fluid to keep your urine pale yellow. °This information is not intended to replace advice given to you by your health care provider. Make sure you discuss any questions you have with your health care provider. °Document Released: 03/05/2009 Document Revised: 06/03/2018 Document Reviewed: 06/03/2018 °Elsevier Patient Education © 2020 [employer] ° ° °Cardiac CT Angiogram ° °A cardiac CT angiogram is a procedure to look at the heart and the area around the heart. It may be done to help find the cause of chest pains or other symptoms of heart disease. During this procedure, a large X-ray machine, called a CT scanner,  takes detailed pictures of the heart and the surrounding area after a dye (contrast material) has been injected into blood vessels in the area. The procedure is also sometimes called a coronary CT angiogram, coronary artery scanning, or CTA. °A cardiac CT angiogram allows the health care provider to see how well blood is flowing to and from the heart. The health care provider will be able to see if there are any problems, such as: °· Blockage or narrowing of the coronary arteries in the heart. °· Fluid around the heart. °· Signs of weakness or disease in the muscles, valves, and tissues of the heart. °Tell a health care provider about: °· Any allergies you have. This is especially important if you have had a previous allergic reaction to contrast dye. °· All medicines you are taking, including vitamins, herbs, eye drops, creams, and over-the-counter medicines. °· Any blood disorders you have. °· Any surgeries you have had. °· Any medical conditions you have. °· Whether you are pregnant or may be pregnant. °· Any anxiety disorders, chronic pain, or other conditions you have that may increase your stress or prevent   you from lying still. °What are the risks? °Generally, this is a safe procedure. However, problems may occur, including: °· Bleeding. °· Infection. °· Allergic reactions to medicines or dyes. °· Damage to other structures or organs. °· Kidney damage from the dye or contrast that is used. °· Increased risk of cancer from radiation exposure. This risk is low. Talk with your health care provider about: °? The risks and benefits of testing. °? How you can receive the lowest dose of radiation. °What happens before the procedure? °· Wear comfortable clothing and remove any jewelry, glasses, dentures, and hearing aids. °· Follow instructions from your health care provider about eating and drinking. This may include: °? For 12 hours before the test -- avoid caffeine. This includes tea, coffee, soda, energy drinks,  and diet pills. Drink plenty of water or other fluids that do not have caffeine in them. Being well-hydrated can prevent complications. °? For 4-6 hours before the test -- stop eating and drinking. The contrast dye can cause nausea, but this is less likely if your stomach is empty. °· Ask your health care provider about changing or stopping your regular medicines. This is especially important if you are taking diabetes medicines, blood thinners, or medicines to treat erectile dysfunction. °What happens during the procedure? °· Hair on your chest may need to be removed so that small sticky patches called electrodes can be placed on your chest. These will transmit information that helps to monitor your heart during the test. °· An IV tube will be inserted into one of your veins. °· You might be given a medicine to control your heart rate during the test. This will help to ensure that good images are obtained. °· You will be asked to lie on an exam table. This table will slide in and out of the CT machine during the procedure. °· Contrast dye will be injected into the IV tube. You might feel warm, or you may get a metallic taste in your mouth. °· You will be given a medicine (nitroglycerin) to relax (dilate) the arteries in your heart. °· The table that you are lying on will move into the CT machine tunnel for the scan. °· The person running the machine will give you instructions while the scans are being done. You may be asked to: °? Keep your arms above your head. °? Hold your breath. °? Stay very still, even if the table is moving. °· When the scanning is complete, you will be moved out of the machine. °· The IV tube will be removed. °The procedure may vary among health care providers and hospitals. °What happens after the procedure? °· You might feel warm, or you may get a metallic taste in your mouth from the contrast dye. °· You may have a headache from the nitroglycerin. °· After the procedure, drink water or  other fluids to wash (flush) the contrast material out of your body. °· Contact a health care provider if you have any symptoms of allergy to the contrast. These symptoms include: °? Shortness of breath. °? Rash or hives. °? A racing heartbeat. °· Most people can return to their normal activities right after the procedure. Ask your health care provider what activities are safe for you. °· It is up to you to get the results of your procedure. Ask your health care provider, or the department that is doing the procedure, when your results will be ready. °Summary °· A cardiac CT angiogram is a procedure to   look at the heart and the area around the heart. It may be done to help find the cause of chest pains or other symptoms of heart disease. °· During this procedure, a large X-ray machine, called a CT scanner, takes detailed pictures of the heart and the surrounding area after a dye (contrast material) has been injected into blood vessels in the area. °· Ask your health care provider about changing or stopping your regular medicines before the procedure. This is especially important if you are taking diabetes medicines, blood thinners, or medicines to treat erectile dysfunction. °· After the procedure, drink water or other fluids to wash (flush) the contrast material out of your body. °This information is not intended to replace advice given to you by your health care provider. Make sure you discuss any questions you have with your health care provider. °Document Released: 02/28/2008 Document Revised: 02/27/2017 Document Reviewed: 02/04/2016 °Elsevier Patient Education © 2020 [employer] Inc. ° °

## 2018-09-29 ENCOUNTER — Telehealth: Payer: Self-pay

## 2018-09-29 NOTE — Telephone Encounter (Signed)
-----   Message from Jenean Lindau, MD sent at 09/23/2018  8:00 AM EDT ----- The results of the study is unremarkable. Please inform patient. I will discuss in detail at next appointment. Cc  primary care/referring physician Jenean Lindau, MD 09/23/2018 8:00 AM

## 2018-09-29 NOTE — Telephone Encounter (Signed)
Information relayed, copy of results sent to [facility] per Dr. Geraldo Pitter.

## 2018-09-29 NOTE — Telephone Encounter (Signed)
-----   Message from Jenean Lindau, MD sent at 09/27/2018  5:13 PM EDT ----- The results of the study is unremarkable. Please inform patient. I will discuss in detail at next appointment. Cc  primary care/referring physician Jenean Lindau, MD 09/27/2018 5:13 PM

## 2018-09-29 NOTE — Telephone Encounter (Signed)
Information relayed, copy of results sent to [facility] per Dr. Geraldo Pitter.

## 2018-10-06 ENCOUNTER — Encounter: Payer: Self-pay | Admitting: Cardiology

## 2018-10-06 ENCOUNTER — Telehealth: Payer: Self-pay | Admitting: *Deleted

## 2018-10-06 ENCOUNTER — Telehealth (INDEPENDENT_AMBULATORY_CARE_PROVIDER_SITE_OTHER): Payer: 59 | Admitting: Cardiology

## 2018-10-06 ENCOUNTER — Other Ambulatory Visit: Payer: Self-pay

## 2018-10-06 VITALS — HR 65 | Ht 63.0 in | Wt 134.0 lb

## 2018-10-06 DIAGNOSIS — Z87891 Personal history of nicotine dependence: Secondary | ICD-10-CM

## 2018-10-06 DIAGNOSIS — I700 Atherosclerosis of aorta: Secondary | ICD-10-CM | POA: Insufficient documentation

## 2018-10-06 DIAGNOSIS — I209 Angina pectoris, unspecified: Secondary | ICD-10-CM | POA: Diagnosis not present

## 2018-10-06 DIAGNOSIS — Z20822 Contact with and (suspected) exposure to covid-19: Secondary | ICD-10-CM

## 2018-10-06 DIAGNOSIS — Z13220 Encounter for screening for lipoid disorders: Secondary | ICD-10-CM

## 2018-10-06 DIAGNOSIS — R0789 Other chest pain: Secondary | ICD-10-CM

## 2018-10-06 NOTE — Telephone Encounter (Signed)
Need to fax the surgical clearance to [facility] Fax # [phone]/QUIV: Dr. Willis Modena MD

## 2018-10-06 NOTE — Telephone Encounter (Signed)
Laurie Atkins with [facility] calling to request COVID-19 testing Referring provider:Todd Meisinger,MD Pt can be contacted at [phone]  Pt called and left message on home number return call to schedule testing.Pt called on mobile number but no answer at this time.Order placed.

## 2018-10-06 NOTE — Patient Instructions (Addendum)
Medication Instructions:  Your physician recommends that you continue on your current medications as directed. Please refer to the Current Medication list given to you today.  If you need a refill on your cardiac medications before your next appointment, please call your pharmacy.   Lab work: NONE If you have labs (blood work) drawn today and your tests are completely normal, you will receive your results only by: . MyChart Message (if you have MyChart) OR . A paper copy in the mail If you have any lab test that is abnormal or we need to change your treatment, we will call you to review the results.  Testing/Procedures: NONE  Follow-Up: At [facility], you and your health needs are our priority.  As part of our continuing mission to provide you with exceptional heart care, we have created designated Provider Care Teams.  These Care Teams include your primary Cardiologist (physician) and Advanced Practice Providers (APPs -  Physician Assistants and Nurse Practitioners) who all work together to provide you with the care you need, when you need it. You will need a follow up appointment as needed. 

## 2018-10-06 NOTE — Addendum Note (Signed)
Addended by: Johnrobert Foti L on: 10/06/2018 04:48 PM   Modules accepted: Orders  

## 2018-10-06 NOTE — Progress Notes (Signed)
Virtual Visit via Video Note   This visit type was conducted due to national recommendations for restrictions regarding the COVID-19 Pandemic (e.g. social distancing) in an effort to limit this patient's exposure and mitigate transmission in our community.  Due to her co-morbid illnesses, this patient is at least at moderate risk for complications without adequate follow up.  This format is felt to be most appropriate for this patient at this time.  All issues noted in this document were discussed and addressed.  A limited physical exam was performed with this format.  Please refer to the patient's chart for her consent to telehealth for [facility].   Date:  10/06/2018   ID:  Laurie Atkins, DOB 23-Apr-1960, MRN [id number]  Patient Location: Home Provider Location: Office  PCP:  [facility]  Cardiologist:  No primary care provider on file.  Electrophysiologist:  None   Evaluation Performed:  Follow-Up Visit  Chief Complaint: Follow-up  History of Present Illness:    Laurie Atkins is a 58 y.o. female with past medical history of smoking.  She had chest discomfort and had abnormal stress testing therefore she underwent CT calcium score and it was 0.  This was within normal limits and patient is here for follow-up to discuss this.  He is very active lady and denies any chest pain now and very reassured about the findings of the test.  At the time of my evaluation, the patient is alert awake oriented and in no distress.  IMPRESSION: 1. Coronary calcium score 0 Agatston units, suggesting low risk future cardiac events.  2.  No significant coronary disease noted.  Dalton Mclean   Electronically Signed   By: Loralie Champagne M.D.   On: 09/27/2018 16:57   The patient does not have symptoms concerning for COVID-19 infection (fever, chills, cough, or new shortness of breath).    History reviewed. No pertinent past medical history. Past Surgical  History:  Procedure Laterality Date  . WISDOM TOOTH EXTRACTION  2020     Current Meds  Medication Sig  . aspirin EC 81 MG tablet Take 81 mg by mouth daily.  . diphenhydrAMINE (BENADRYL) 25 MG tablet Take 25 mg by mouth at bedtime.   . Multiple Vitamin (MULTIVITAMIN) capsule Take 1 capsule by mouth daily.     Allergies:   Patient has no known allergies.   Social History   Tobacco Use  . Smoking status: Former Research scientist (life sciences)  . Smokeless tobacco: Never Used  Substance Use Topics  . Alcohol use: Not on file  . Drug use: Not on file     Family Hx: The patient's family history includes Arrhythmia in her mother; Heart Problems in her father.  ROS:   Please see the history of present illness.    As mentioned above All other systems reviewed and are negative.   Prior CV studies:   The following studies were reviewed today:  IMPRESSION: 1. Coronary calcium score 0 Agatston units, suggesting low risk future cardiac events.  2.  No significant coronary disease noted.  Dalton Mclean   Electronically Signed   By: Loralie Champagne M.D.   On: 09/27/2018 16:57   Labs/Other Tests and Data Reviewed:    EKG:  No ECG reviewed.  Recent Labs: 09/22/2018: BUN 12; Creatinine, Ser 0.59; Potassium 4.2; Sodium 141   Recent Lipid Panel No results found for: CHOL, TRIG, HDL, CHOLHDL, LDLCALC, LDLDIRECT  Wt Readings from Last 3 Encounters:  10/06/18 134 lb (60.8 kg)  09/13/18 135 lb 6.4 oz (61.4 kg)  09/02/18 142 lb (64.4 kg)     Objective:    Vital Signs:  Pulse 65   Ht 5\' 3"  (1.6 m)   Wt 134 lb (60.8 kg)   BMI 23.74 kg/m    VITAL SIGNS:  reviewed  ASSESSMENT & PLAN:    1. Chest discomfort: I discussed my findings with the patient at length.  Her stress test obviously is a false positive now.  Her CT calcium score revealed completely normal findings.  It was normal.  She plans to undergo colonoscopy and she has at low risk for any coronary issues from such a procedure.  I  discussed this with her at length.  I advised her never to go back to smoking and she agrees.  Lipids and other health maintenance issues will be followed by her primary care physician and she will be seen in follow-up appointment on a as needed basis only.  She has had mild aortic atherosclerosis as expected for her age.  Her lipids will have to be followed by her primary care physician and she can be on statin therapy as appropriate.  COVID-19 Education: The signs and symptoms of COVID-19 were discussed with the patient and how to seek care for testing (follow up with PCP or arrange E-visit).  The importance of social distancing was discussed today.  Time:   Today, I have spent 15 minutes with the patient with telehealth technology discussing the above problems.     Medication Adjustments/Labs and Tests Ordered: Current medicines are reviewed at length with the patient today.  Concerns regarding medicines are outlined above.   Tests Ordered: No orders of the defined types were placed in this encounter.   Medication Changes: No orders of the defined types were placed in this encounter.   Follow Up:  prn  Signed, Garwin Brothersajan R Revankar, MD  10/06/2018 3:25 PM    [facility]

## 2018-10-06 NOTE — Addendum Note (Signed)
Addended by: Beckey Rutter on: 10/06/2018 05:09 PM   Modules accepted: Orders

## 2018-10-07 ENCOUNTER — Other Ambulatory Visit: Payer: 59

## 2018-10-07 DIAGNOSIS — Z20822 Contact with and (suspected) exposure to covid-19: Secondary | ICD-10-CM

## 2018-10-07 NOTE — Telephone Encounter (Signed)
Pt. Called back and scheduled for today. 

## 2018-10-11 LAB — NOVEL CORONAVIRUS, NAA: SARS-CoV-2, NAA: NOT DETECTED

## 2018-10-18 NOTE — Telephone Encounter (Signed)
Surgical clearance faxed to [facility].

## 2018-12-09 ENCOUNTER — Ambulatory Visit: Payer: 59 | Admitting: Cardiology

## 2018-12-13 ENCOUNTER — Other Ambulatory Visit: Payer: Self-pay

## 2018-12-13 ENCOUNTER — Encounter: Payer: Self-pay | Admitting: Cardiology

## 2018-12-13 ENCOUNTER — Ambulatory Visit (INDEPENDENT_AMBULATORY_CARE_PROVIDER_SITE_OTHER): Payer: 59 | Admitting: Cardiology

## 2018-12-13 VITALS — BP 132/80 | HR 69 | Ht 63.0 in | Wt 137.0 lb

## 2018-12-13 DIAGNOSIS — Z87891 Personal history of nicotine dependence: Secondary | ICD-10-CM | POA: Diagnosis not present

## 2018-12-13 DIAGNOSIS — I700 Atherosclerosis of aorta: Secondary | ICD-10-CM

## 2018-12-13 DIAGNOSIS — R0789 Other chest pain: Secondary | ICD-10-CM

## 2018-12-13 NOTE — Patient Instructions (Signed)
Medication Instructions:  Your physician recommends that you continue on your current medications as directed. Please refer to the Current Medication list given to you today.  If you need a refill on your cardiac medications before your next appointment, please call your pharmacy.   Lab work: NONE If you have labs (blood work) drawn today and your tests are completely normal, you will receive your results only by: . MyChart Message (if you have MyChart) OR . A paper copy in the mail If you have any lab test that is abnormal or we need to change your treatment, we will call you to review the results.  Testing/Procedures: NONE  Follow-Up: At [facility], you and your health needs are our priority.  As part of our continuing mission to provide you with exceptional heart care, we have created designated Provider Care Teams.  These Care Teams include your primary Cardiologist (physician) and Advanced Practice Providers (APPs -  Physician Assistants and Nurse Practitioners) who all work together to provide you with the care you need, when you need it. You will need a follow up appointment as needed. 

## 2018-12-13 NOTE — Progress Notes (Signed)
Cardiology Office Note:    Date:  12/13/2018   ID:  Laurie SowSharon Cokley, DOB 1961/03/26, MRN [id number]  PCP:  [place], [facility]  Cardiologist:  Garwin Brothersajan R Letroy Vazguez, MD   Referring MD: [place]oe [place], [place]*    ASSESSMENT:    1. Chest discomfort   2. Ex-smoker   3. Aortic atherosclerosis (HCC)    PLAN:    In order of problems listed above:  1. Primary prevention stressed with the patient.  Importance of compliance with diet and medication stressed and she vocalized understanding.  She is going to start a regular exercise program and be very meticulous about this and diet.  I reemphasized this at length and questions about the below mentioned test were answered to her satisfaction. 2. Ex-smoker: She promises never to go to work. 3. She will be seen in follow-up appointment on a as needed basis only.   Medication Adjustments/Labs and Tests Ordered: Current medicines are reviewed at length with the patient today.  Concerns regarding medicines are outlined above.  No orders of the defined types were placed in this encounter.  No orders of the defined types were placed in this encounter.    Chief Complaint  Patient presents with  . Follow-up     History of Present Illness:    Laurie Atkins is a 58 y.o. female.  Patient was evaluated by me for chest discomfort.  Her stress test was abnormal but her CT scan was remarkably fine.  She subsequently has had no problems and walks on a regular basis without any issues.  She is very happy to hear about these results.  They are noted below in detail.  No past medical history on file.  Past Surgical History:  Procedure Laterality Date  . WISDOM TOOTH EXTRACTION  2020    Current Medications: Current Meds  Medication Sig  . aspirin EC 81 MG tablet Take 81 mg by mouth daily.  . diphenhydrAMINE (BENADRYL) 25 MG tablet Take 25 mg by mouth at bedtime.   . Melatonin-Pyridoxine (MELATIN PO) Take by mouth as needed.   . Multiple Vitamin (MULTIVITAMIN) capsule Take 1 capsule by mouth daily.     Allergies:   Patient has no known allergies.   Social History   Socioeconomic History  . Marital status: Unknown    Spouse name: Not on file  . Number of children: Not on file  . Years of education: Not on file  . Highest education level: Not on file  Occupational History  . Not on file  Social Needs  . Financial resource strain: Not on file  . Food insecurity    Worry: Not on file    Inability: Not on file  . Transportation needs    Medical: Not on file    Non-medical: Not on file  Tobacco Use  . Smoking status: Former Games developermoker  . Smokeless tobacco: Never Used  Substance and Sexual Activity  . Alcohol use: Not on file  . Drug use: Not on file  . Sexual activity: Not on file  Lifestyle  . Physical activity    Days per week: Not on file    Minutes per session: Not on file  . Stress: Not on file  Relationships  . Social Musicianconnections    Talks on phone: Not on file    Gets together: Not on file    Attends religious service: Not on file    Active member of club or organization: Not on file    Attends  meetings of clubs or organizations: Not on file    Relationship status: Not on file  Other Topics Concern  . Not on file  Social History Narrative  . Not on file     Family History: The patient's family history includes Arrhythmia in her mother; Heart Problems in her father.  ROS:   Please see the history of present illness.    All other systems reviewed and are negative.  EKGs/Labs/Other Studies Reviewed:    The following studies were reviewed today: Study Highlights   The left ventricular ejection fraction is mildly decreased (45-54%).  Nuclear stress EF: 54%.  Blood pressure demonstrated a normal response to exercise.  There was no ST segment deviation noted during stress.  Defect 1: There is a small defect of mild severity present in the apical septal location.  Findings  consistent with ischemia.  This is a low risk study.  Small area of ischemia onvolving apical portion of the septum.   IMPRESSION: 1. Coronary calcium score 0 Agatston units, suggesting low risk future cardiac events.  2.  No significant coronary disease noted.  Dalton Mclean   Electronically Signed   By: Loralie Champagne M.D.   On: 09/27/2018 16:57    Recent Labs: 09/22/2018: BUN 12; Creatinine, Ser 0.59; Potassium 4.2; Sodium 141  Recent Lipid Panel No results found for: CHOL, TRIG, HDL, CHOLHDL, VLDL, LDLCALC, LDLDIRECT  Physical Exam:    VS:  BP 132/80 (BP Location: Left Arm, Patient Position: Sitting, Cuff Size: Normal)   Pulse 69   Ht 5\' 3"  (1.6 m)   Wt 137 lb (62.1 kg)   SpO2 98%   BMI 24.27 kg/m     Wt Readings from Last 3 Encounters:  12/13/18 137 lb (62.1 kg)  10/06/18 134 lb (60.8 kg)  09/13/18 135 lb 6.4 oz (61.4 kg)     GEN: Patient is in no acute distress HEENT: Normal NECK: No JVD; No carotid bruits LYMPHATICS: No lymphadenopathy CARDIAC: Hear sounds regular, 2/6 systolic murmur at the apex. RESPIRATORY:  Clear to auscultation without rales, wheezing or rhonchi  ABDOMEN: Soft, non-tender, non-distended MUSCULOSKELETAL:  No edema; No deformity  SKIN: Warm and dry NEUROLOGIC:  Alert and oriented x 3 PSYCHIATRIC:  Normal affect   Signed, Jenean Lindau, MD  12/13/2018 2:34 PM    [place]

## 2019-11-08 ENCOUNTER — Other Ambulatory Visit: Payer: Self-pay | Admitting: Cardiology

## 2019-11-08 DIAGNOSIS — I209 Angina pectoris, unspecified: Secondary | ICD-10-CM

## 2020-04-17 ENCOUNTER — Inpatient Hospital Stay (HOSPITAL_COMMUNITY): Payer: PRIVATE HEALTH INSURANCE

## 2020-04-17 ENCOUNTER — Other Ambulatory Visit: Payer: Self-pay

## 2020-04-17 ENCOUNTER — Inpatient Hospital Stay (HOSPITAL_COMMUNITY): Payer: PRIVATE HEALTH INSURANCE | Admitting: Anesthesiology

## 2020-04-17 ENCOUNTER — Encounter (HOSPITAL_COMMUNITY): Payer: Self-pay | Admitting: Orthopedic Surgery

## 2020-04-17 ENCOUNTER — Encounter (HOSPITAL_COMMUNITY)
Admission: EM | Disposition: A | Discharge status: Home Health | Payer: Self-pay | Source: Home / Self Care | Attending: Orthopedic Surgery

## 2020-04-17 ENCOUNTER — Inpatient Hospital Stay (HOSPITAL_COMMUNITY)
Admission: EM | Admit: 2020-04-17 | Discharge: 2020-04-25 | DRG: 482 | Disposition: A | Discharge status: Home Health | Payer: PRIVATE HEALTH INSURANCE | Attending: Orthopedic Surgery | Admitting: Orthopedic Surgery

## 2020-04-17 ENCOUNTER — Emergency Department (HOSPITAL_COMMUNITY): Payer: PRIVATE HEALTH INSURANCE

## 2020-04-17 DIAGNOSIS — Z20822 Contact with and (suspected) exposure to covid-19: Secondary | ICD-10-CM | POA: Diagnosis present

## 2020-04-17 DIAGNOSIS — R52 Pain, unspecified: Secondary | ICD-10-CM

## 2020-04-17 DIAGNOSIS — Y92481 Parking lot as the place of occurrence of the external cause: Secondary | ICD-10-CM | POA: Diagnosis not present

## 2020-04-17 DIAGNOSIS — Z87891 Personal history of nicotine dependence: Secondary | ICD-10-CM | POA: Diagnosis not present

## 2020-04-17 DIAGNOSIS — S7291XA Unspecified fracture of right femur, initial encounter for closed fracture: Secondary | ICD-10-CM | POA: Diagnosis present

## 2020-04-17 DIAGNOSIS — W000XXA Fall on same level due to ice and snow, initial encounter: Secondary | ICD-10-CM | POA: Diagnosis present

## 2020-04-17 DIAGNOSIS — Z419 Encounter for procedure for purposes other than remedying health state, unspecified: Secondary | ICD-10-CM

## 2020-04-17 DIAGNOSIS — W19XXXA Unspecified fall, initial encounter: Secondary | ICD-10-CM

## 2020-04-17 DIAGNOSIS — S7221XA Displaced subtrochanteric fracture of right femur, initial encounter for closed fracture: Principal | ICD-10-CM | POA: Diagnosis present

## 2020-04-17 HISTORY — PX: FEMUR IM NAIL: SHX1597

## 2020-04-17 LAB — RESP PANEL BY RT-PCR (FLU A&B, COVID) ARPGX2
Influenza A by PCR: NEGATIVE
Influenza B by PCR: NEGATIVE
SARS Coronavirus 2 by RT PCR: NEGATIVE

## 2020-04-17 LAB — COMPREHENSIVE METABOLIC PANEL
ALT: 22 U/L (ref 0–44)
AST: 22 U/L (ref 15–41)
Albumin: 4 g/dL (ref 3.5–5.0)
Alkaline Phosphatase: 39 U/L (ref 38–126)
Anion gap: 10 (ref 5–15)
BUN: 11 mg/dL (ref 6–20)
CO2: 25 mmol/L (ref 22–32)
Calcium: 9.3 mg/dL (ref 8.9–10.3)
Chloride: 104 mmol/L (ref 98–111)
Creatinine, Ser: 0.7 mg/dL (ref 0.44–1.00)
GFR, Estimated: >60 mL/min (ref >60)
Glucose, Bld: 111 mg/dL — ABNORMAL HIGH (ref 70–99)
Potassium: 3.7 mmol/L (ref 3.5–5.1)
Sodium: 139 mmol/L (ref 135–145)
Total Bilirubin: 0.6 mg/dL (ref 0.3–1.2)
Total Protein: 6.6 g/dL (ref 6.5–8.1)

## 2020-04-17 LAB — CBC
HCT: 39.7 % (ref 36.0–46.0)
Hemoglobin: 12.8 g/dL (ref 12.0–15.0)
MCH: 27.6 pg (ref 26.0–34.0)
MCHC: 32.2 g/dL (ref 30.0–36.0)
MCV: 85.7 fL (ref 80.0–100.0)
Platelets: 183 10*3/uL (ref 150–400)
RBC: 4.63 MIL/uL (ref 3.87–5.11)
RDW: 13 % (ref 11.5–15.5)
WBC: 7.3 10*3/uL (ref 4.0–10.5)
nRBC: 0 % (ref 0.0–0.2)

## 2020-04-17 LAB — BASIC METABOLIC PANEL
Anion gap: 6 (ref 5–15)
BUN: 8 mg/dL (ref 6–20)
CO2: 17 mmol/L — ABNORMAL LOW (ref 22–32)
Calcium: 5.5 mg/dL — CL (ref 8.9–10.3)
Chloride: 119 mmol/L — ABNORMAL HIGH (ref 98–111)
Creatinine, Ser: 0.4 mg/dL — ABNORMAL LOW (ref 0.44–1.00)
GFR, Estimated: >60 mL/min (ref >60)
Glucose, Bld: 77 mg/dL (ref 70–99)
Potassium: 2.2 mmol/L — CL (ref 3.5–5.1)
Sodium: 142 mmol/L (ref 135–145)

## 2020-04-17 LAB — MAGNESIUM: Magnesium: 1.8 mg/dL (ref 1.7–2.4)

## 2020-04-17 SURGERY — INSERTION, INTRAMEDULLARY ROD, FEMUR
Anesthesia: General | Site: Leg Upper | Laterality: Right

## 2020-04-17 MED ORDER — HYDROMORPHONE HCL 1 MG/ML IJ SOLN
INTRAMUSCULAR | Status: AC
  Administered 2020-04-17: 0.5 mg via INTRAVENOUS
  Filled 2020-04-17: qty 1

## 2020-04-17 MED ORDER — HYDROMORPHONE HCL 1 MG/ML IJ SOLN
INTRAMUSCULAR | Status: DC | PRN
  Administered 2020-04-17: .5 mg via INTRAVENOUS

## 2020-04-17 MED ORDER — ONDANSETRON HCL 4 MG/2ML IJ SOLN
INTRAMUSCULAR | Status: AC
  Filled 2020-04-17: qty 2

## 2020-04-17 MED ORDER — HYDROMORPHONE HCL 1 MG/ML IJ SOLN: 0.2500 mg | INTRAMUSCULAR | Status: DC | PRN

## 2020-04-17 MED ORDER — LIDOCAINE 2% (20 MG/ML) 5 ML SYRINGE
INTRAMUSCULAR | Status: DC | PRN
  Administered 2020-04-17: 60 mg via INTRAVENOUS

## 2020-04-17 MED ORDER — ONDANSETRON 4 MG PO TBDP: 4.0000 mg | ORAL_TABLET | Freq: Three times a day (TID) | ORAL | 0 refills | Status: AC | PRN

## 2020-04-17 MED ORDER — ROCURONIUM BROMIDE 10 MG/ML (PF) SYRINGE
PREFILLED_SYRINGE | INTRAVENOUS | Status: DC | PRN
  Administered 2020-04-17: 50 mg via INTRAVENOUS
  Administered 2020-04-17: 20 mg via INTRAVENOUS

## 2020-04-17 MED ORDER — MIDAZOLAM HCL 2 MG/2ML IJ SOLN
INTRAMUSCULAR | Status: AC
  Filled 2020-04-17: qty 2

## 2020-04-17 MED ORDER — ONDANSETRON HCL 4 MG/2ML IJ SOLN
4.0000 mg | Freq: Once | INTRAMUSCULAR | Status: AC
  Administered 2020-04-17: 4 mg via INTRAVENOUS
  Filled 2020-04-17: qty 2

## 2020-04-17 MED ORDER — DEXAMETHASONE SODIUM PHOSPHATE 10 MG/ML IJ SOLN
INTRAMUSCULAR | Status: AC
  Filled 2020-04-17: qty 1

## 2020-04-17 MED ORDER — OXYCODONE HCL 5 MG PO TABS: 5.0000 mg | ORAL_TABLET | Freq: Once | ORAL | Status: DC | PRN

## 2020-04-17 MED ORDER — 0.9 % SODIUM CHLORIDE (POUR BTL) OPTIME
TOPICAL | Status: DC | PRN
  Administered 2020-04-17: 1000 mL

## 2020-04-17 MED ORDER — PHENYLEPHRINE 40 MCG/ML (10ML) SYRINGE FOR IV PUSH (FOR BLOOD PRESSURE SUPPORT)
PREFILLED_SYRINGE | INTRAVENOUS | Status: DC | PRN
  Administered 2020-04-17: 80 ug via INTRAVENOUS
  Administered 2020-04-17 (×2): 120 ug via INTRAVENOUS

## 2020-04-17 MED ORDER — MORPHINE SULFATE (PF) 4 MG/ML IV SOLN
4.0000 mg | Freq: Once | INTRAVENOUS | Status: AC
  Administered 2020-04-17: 4 mg via INTRAVENOUS
  Filled 2020-04-17: qty 1

## 2020-04-17 MED ORDER — MIDAZOLAM HCL 2 MG/2ML IJ SOLN
INTRAMUSCULAR | Status: DC | PRN
  Administered 2020-04-17: 2 mg via INTRAVENOUS

## 2020-04-17 MED ORDER — OXYCODONE HCL 5 MG PO TABS: 5.0000 mg | ORAL_TABLET | ORAL | Status: DC | PRN

## 2020-04-17 MED ORDER — ROCURONIUM BROMIDE 10 MG/ML (PF) SYRINGE
PREFILLED_SYRINGE | INTRAVENOUS | Status: AC
  Filled 2020-04-17: qty 10

## 2020-04-17 MED ORDER — FENTANYL CITRATE (PF) 250 MCG/5ML IJ SOLN
INTRAMUSCULAR | Status: DC | PRN
  Administered 2020-04-17 (×3): 50 ug via INTRAVENOUS
  Administered 2020-04-17: 100 ug via INTRAVENOUS

## 2020-04-17 MED ORDER — OXYCODONE HCL 5 MG PO TABS
5.0000 mg | ORAL_TABLET | ORAL | Status: DC | PRN
  Administered 2020-04-17 – 2020-04-24 (×18): 10 mg via ORAL
  Administered 2020-04-24: 5 mg via ORAL
  Administered 2020-04-25 (×2): 10 mg via ORAL
  Filled 2020-04-17 (×24): qty 2

## 2020-04-17 MED ORDER — ENOXAPARIN SODIUM 40 MG/0.4ML ~~LOC~~ SOLN: 40.0000 mg | SUBCUTANEOUS | Status: DC

## 2020-04-17 MED ORDER — LIDOCAINE 2% (20 MG/ML) 5 ML SYRINGE
INTRAMUSCULAR | Status: AC
  Filled 2020-04-17: qty 5

## 2020-04-17 MED ORDER — METHOCARBAMOL 500 MG PO TABS: 500.0000 mg | ORAL_TABLET | Freq: Four times a day (QID) | ORAL | 1 refills | Status: DC | PRN

## 2020-04-17 MED ORDER — ONDANSETRON HCL 4 MG PO TABS
4.0000 mg | ORAL_TABLET | Freq: Four times a day (QID) | ORAL | Status: DC | PRN
  Administered 2020-04-18 – 2020-04-25 (×10): 4 mg via ORAL
  Filled 2020-04-17 (×11): qty 1

## 2020-04-17 MED ORDER — ONDANSETRON HCL 4 MG/2ML IJ SOLN: 4.0000 mg | Freq: Once | INTRAMUSCULAR | Status: AC | PRN

## 2020-04-17 MED ORDER — FENTANYL CITRATE (PF) 250 MCG/5ML IJ SOLN
INTRAMUSCULAR | Status: AC
  Filled 2020-04-17: qty 5

## 2020-04-17 MED ORDER — AMISULPRIDE (ANTIEMETIC) 5 MG/2ML IV SOLN: 10.0000 mg | Freq: Once | INTRAVENOUS | Status: DC | PRN

## 2020-04-17 MED ORDER — SENNOSIDES-DOCUSATE SODIUM 8.6-50 MG PO TABS: 1.0000 | ORAL_TABLET | Freq: Every evening | ORAL | Status: DC | PRN

## 2020-04-17 MED ORDER — ENOXAPARIN SODIUM 40 MG/0.4ML ~~LOC~~ SOLN
40.0000 mg | SUBCUTANEOUS | Status: DC
  Administered 2020-04-18 – 2020-04-25 (×8): 40 mg via SUBCUTANEOUS
  Filled 2020-04-17 (×8): qty 0.4

## 2020-04-17 MED ORDER — MORPHINE SULFATE (PF) 2 MG/ML IV SOLN
0.5000 mg | INTRAVENOUS | Status: DC | PRN
  Filled 2020-04-17: qty 1

## 2020-04-17 MED ORDER — PROPOFOL 10 MG/ML IV BOLUS
INTRAVENOUS | Status: AC
  Filled 2020-04-17: qty 20

## 2020-04-17 MED ORDER — OXYCODONE HCL 5 MG PO TABS
5.0000 mg | ORAL_TABLET | ORAL | 0 refills | Status: AC | PRN
Start: 2020-04-17 — End: 2021-04-17

## 2020-04-17 MED ORDER — ONDANSETRON HCL 4 MG/2ML IJ SOLN
4.0000 mg | Freq: Four times a day (QID) | INTRAMUSCULAR | Status: DC | PRN
  Administered 2020-04-19: 4 mg via INTRAVENOUS
  Filled 2020-04-17: qty 2

## 2020-04-17 MED ORDER — CHLORHEXIDINE GLUCONATE 0.12 % MT SOLN
15.0000 mL | Freq: Once | OROMUCOSAL | Status: DC
  Administered 2020-04-17: 15 mL via OROMUCOSAL
  Filled 2020-04-17: qty 15

## 2020-04-17 MED ORDER — CEFAZOLIN SODIUM-DEXTROSE 2-4 GM/100ML-% IV SOLN
2.0000 g | INTRAVENOUS | Status: DC
  Administered 2020-04-17: 2 g via INTRAVENOUS
  Filled 2020-04-17: qty 100

## 2020-04-17 MED ORDER — HYDROMORPHONE HCL 1 MG/ML IJ SOLN
INTRAMUSCULAR | Status: AC
  Filled 2020-04-17: qty 0.5

## 2020-04-17 MED ORDER — METHOCARBAMOL 500 MG PO TABS: 500.0000 mg | ORAL_TABLET | Freq: Four times a day (QID) | ORAL | Status: DC | PRN

## 2020-04-17 MED ORDER — METHOCARBAMOL 1000 MG/10ML IJ SOLN: 500.0000 mg | Freq: Four times a day (QID) | INTRAVENOUS | Status: DC | PRN

## 2020-04-17 MED ORDER — DEXAMETHASONE SODIUM PHOSPHATE 10 MG/ML IJ SOLN
INTRAMUSCULAR | Status: DC | PRN
  Administered 2020-04-17: 5 mg via INTRAVENOUS

## 2020-04-17 MED ORDER — METHOCARBAMOL 500 MG PO TABS
500.0000 mg | ORAL_TABLET | Freq: Four times a day (QID) | ORAL | Status: DC | PRN
  Administered 2020-04-19 – 2020-04-24 (×6): 500 mg via ORAL
  Filled 2020-04-17 (×10): qty 1

## 2020-04-17 MED ORDER — ACETAMINOPHEN 10 MG/ML IV SOLN: 1000.0000 mg | Freq: Once | INTRAVENOUS | Status: DC | PRN

## 2020-04-17 MED ORDER — ONDANSETRON HCL 4 MG/2ML IJ SOLN
INTRAMUSCULAR | Status: AC
  Administered 2020-04-17: 4 mg via INTRAVENOUS
  Filled 2020-04-17: qty 2

## 2020-04-17 MED ORDER — CHLORHEXIDINE GLUCONATE 4 % EX LIQD
60.0000 mL | Freq: Once | CUTANEOUS | Status: AC
  Administered 2020-04-17: 4 via TOPICAL
  Filled 2020-04-17: qty 60

## 2020-04-17 MED ORDER — PROPOFOL 10 MG/ML IV BOLUS
INTRAVENOUS | Status: DC | PRN
  Administered 2020-04-17: 30 mg via INTRAVENOUS
  Administered 2020-04-17: 140 mg via INTRAVENOUS
  Administered 2020-04-17: 30 mg via INTRAVENOUS

## 2020-04-17 MED ORDER — METHOCARBAMOL 1000 MG/10ML IJ SOLN: 500.0000 mg | Freq: Four times a day (QID) | INTRAMUSCULAR | Status: DC | PRN

## 2020-04-17 MED ORDER — ONDANSETRON HCL 4 MG/2ML IJ SOLN
INTRAMUSCULAR | Status: DC | PRN
  Administered 2020-04-17: 4 mg via INTRAVENOUS

## 2020-04-17 MED ORDER — POVIDONE-IODINE 10 % EX SWAB: 2.0000 "application " | Freq: Once | CUTANEOUS | Status: DC

## 2020-04-17 MED ORDER — LACTATED RINGERS IV SOLN: INTRAVENOUS | Status: DC

## 2020-04-17 MED ORDER — OXYCODONE HCL 5 MG/5ML PO SOLN: 5.0000 mg | Freq: Once | ORAL | Status: DC | PRN

## 2020-04-17 SURGICAL SUPPLY — 49 items
ALCOHOL 70% 16 OZ (MISCELLANEOUS) ×2 IMPLANT
BIT DRILL CANN HP 16 (BIT) ×2 IMPLANT
BIT DRILL SHORT 4.2 (BIT) ×2 IMPLANT
BNDG COHESIVE 4X5 TAN STRL (GAUZE/BANDAGES/DRESSINGS) ×2 IMPLANT
BNDG COHESIVE 6X5 TAN STRL LF (GAUZE/BANDAGES/DRESSINGS) ×2 IMPLANT
COVER PERINEAL POST (MISCELLANEOUS) ×2 IMPLANT
COVER SURGICAL LIGHT HANDLE (MISCELLANEOUS) ×2 IMPLANT
COVER WAND RF STERILE (DRAPES) ×2 IMPLANT
DRAPE C-ARMOR (DRAPES) IMPLANT
DRAPE HALF SHEET 40X57 (DRAPES) IMPLANT
DRAPE INCISE IOBAN 66X45 STRL (DRAPES) ×2 IMPLANT
DRAPE ORTHO SPLIT 77X108 STRL (DRAPES)
DRAPE STERI IOBAN 125X83 (DRAPES) ×6 IMPLANT
DRAPE SURG ORHT 6 SPLT 77X108 (DRAPES) IMPLANT
DRILL BIT SHORT 4.2 (BIT) ×2
DRSG ADAPTIC 3X8 NADH LF (GAUZE/BANDAGES/DRESSINGS) ×2 IMPLANT
DRSG TEGADERM 2-3/8X2-3/4 SM (GAUZE/BANDAGES/DRESSINGS) IMPLANT
DRSG TEGADERM 4X4.75 (GAUZE/BANDAGES/DRESSINGS) ×4 IMPLANT
DURAPREP 26ML APPLICATOR (WOUND CARE) ×2 IMPLANT
ELECT REM PT RETURN 9FT ADLT (ELECTROSURGICAL) ×2
ELECTRODE REM PT RTRN 9FT ADLT (ELECTROSURGICAL) ×1 IMPLANT
FACESHIELD WRAPAROUND (MASK) IMPLANT
GAUZE SPONGE 4X4 12PLY STRL LF (GAUZE/BANDAGES/DRESSINGS) ×2 IMPLANT
GAUZE XEROFORM 1X8 LF (GAUZE/BANDAGES/DRESSINGS) IMPLANT
GLOVE BIO SURGEON STRL SZ7.5 (GLOVE) ×6 IMPLANT
GLOVE BIOGEL PI IND STRL 8 (GLOVE) ×2 IMPLANT
GLOVE BIOGEL PI INDICATOR 8 (GLOVE) ×2
GOWN STRL REUS W/ TWL LRG LVL3 (GOWN DISPOSABLE) ×2 IMPLANT
GOWN STRL REUS W/ TWL XL LVL3 (GOWN DISPOSABLE) ×1 IMPLANT
GOWN STRL REUS W/TWL LRG LVL3 (GOWN DISPOSABLE) ×2
GOWN STRL REUS W/TWL XL LVL3 (GOWN DISPOSABLE) ×3 IMPLANT
GUIDEWIRE 3.2X400 (WIRE) ×4 IMPLANT
KIT BASIN OR (CUSTOM PROCEDURE TRAY) ×2 IMPLANT
KIT TURNOVER KIT B (KITS) ×2 IMPLANT
MANIFOLD NEPTUNE II (INSTRUMENTS) ×2 IMPLANT
NAIL CAN TFNA 9 130D 380 RT (Nail) ×2 IMPLANT
NS IRRIG 1000ML POUR BTL (IV SOLUTION) ×2 IMPLANT
PACK GENERAL/GYN (CUSTOM PROCEDURE TRAY) ×2 IMPLANT
PAD ARMBOARD 7.5X6 YLW CONV (MISCELLANEOUS) ×4 IMPLANT
REAMER ROD DEEP FLUTE 2.5X950 (INSTRUMENTS) ×2 IMPLANT
SCREW FENS TFNA 85 (Screw) ×2 IMPLANT
SCREW LOCK STAR 5X42 (Screw) ×2 IMPLANT
SCREW LOCK STAR 5X48 (Screw) ×2 IMPLANT
STAPLER VISISTAT 35W (STAPLE) ×2 IMPLANT
SUT VIC AB 0 CT1 27 (SUTURE) ×1
SUT VIC AB 0 CT1 27XBRD ANBCTR (SUTURE) ×1 IMPLANT
SUT VIC AB 2-0 CT1 27 (SUTURE) ×3
SUT VIC AB 2-0 CT1 TAPERPNT 27 (SUTURE) ×3 IMPLANT
YANKAUER SUCT BULB TIP NO VENT (SUCTIONS) ×2 IMPLANT

## 2020-04-17 NOTE — Transfer of Care (Signed)
Immediate Anesthesia Transfer of Care Note  Patient: Laurie Atkins  Procedure(s) Performed: INTRAMEDULLARY (IM) NAIL FEMORAL (Right Leg Upper)  Patient Location: PACU  Anesthesia Type:General  Level of Consciousness: drowsy and patient cooperative  Airway & Oxygen Therapy: Patient Spontanous Breathing  Post-op Assessment: Report given to RN and Post -op Vital signs reviewed and stable  Post vital signs: Reviewed and stable  Last Vitals:  Vitals Value Taken Time  BP 130/95 04/17/20 2025  Temp    Pulse 92 04/17/20 2026  Resp 13 04/17/20 2026  SpO2 100 % 04/17/20 2026  Vitals shown include unvalidated device data.  Last Pain:  Vitals:   04/17/20 1713  TempSrc:   PainSc: (P) 6          Complications: No complications documented.

## 2020-04-17 NOTE — ED Provider Notes (Signed)
[facility] EMERGENCY DEPARTMENT Provider Note   CSN: [id number] Arrival date & time: 04/17/20  1218     History Chief Complaint  Patient presents with  . Fall    Laurie Atkins is a 60 y.o. female with no significant past medical history presents the ED via EMS after fall.  Concern for right femoral fracture.  On my examination, patient reports that she had just arrived at work and was stepping out of her car when she slipped and her leg went up out from underneath her.  She fell and landed on her right side and immediately felt pain and pressure in her right upper leg.  She states that she is able to move her foot and ankle, but is afraid to pick up her leg.  She is otherwise healthy and not on any anticoagulation.  She denies any head injury, back pain, neck pain, headache or dizziness, loss of consciousness, chest pain or abdominal pain, shortness of breath, or any other symptoms.    She states that her pain is relatively well controlled with the fentanyl.  HPI     No past medical history on file.  Patient Active Problem List   Diagnosis Date Noted  . Closed fracture of right femur (HCC) 04/17/2020  . Aortic atherosclerosis (HCC) 10/06/2018  . Chest discomfort 06/04/2018  . Ex-smoker 06/04/2018    Past Surgical History:  Procedure Laterality Date  . WISDOM TOOTH EXTRACTION  2020     OB History   No obstetric history on file.     Family History  Problem Relation Age of Onset  . Arrhythmia Mother   . Heart Problems Father     Social History   Tobacco Use  . Smoking status: Former Games developer  . Smokeless tobacco: Never Used    Home Medications Prior to Admission medications   Medication Sig Start Date End Date Taking? Authorizing Provider  aspirin EC 81 MG tablet Take 81 mg by mouth daily.    [provider]  diphenhydrAMINE (BENADRYL) 25 MG tablet Take 25 mg by mouth at bedtime.     [provider]  Melatonin-Pyridoxine  (MELATIN PO) Take by mouth as needed.    [provider]  Multiple Vitamin (MULTIVITAMIN) capsule Take 1 capsule by mouth daily.    [provider]    Allergies    Patient has no known allergies.  Review of Systems   Review of Systems  All other systems reviewed and are negative.   Physical Exam Updated Vital Signs BP (!) 142/79   Pulse 78   Temp 98.3 F (36.8 C) (Oral)   Resp 12   Ht 5\' 4"  (1.626 m)   Wt 62.1 kg   SpO2 99%   BMI 23.52 kg/m   Physical Exam Vitals and nursing note reviewed. Exam conducted with a chaperone present.  Constitutional:      Appearance: Normal appearance.  HENT:     Head: Normocephalic and atraumatic.  Eyes:     General: No scleral icterus.    Conjunctiva/sclera: Conjunctivae normal.  Cardiovascular:     Rate and Rhythm: Normal rate and regular rhythm.     Pulses: Normal pulses.  Pulmonary:     Effort: Pulmonary effort is normal. No respiratory distress.  Abdominal:     General: Abdomen is flat. There is no distension.     Palpations: Abdomen is soft.     Tenderness: There is no abdominal tenderness.  Musculoskeletal:  General: Swelling, tenderness and deformity present.     Comments: Right hip: No tenderness.  Reluctance to flex hip due to pain in femoral region. Right femur: Lateral swelling and deformity appreciated, midshaft.  Associated tenderness.  Compartments are soft. Right knee: No tenderness.  Hesitance to perform flexion and extension due to pain proximally. Right ankle: Flexion and extension with strength intact against resistance.  Pedal pulse intact and symmetric with contralateral foot.  Sensation intact throughout.  Wiggles toes.  Skin:    General: Skin is dry.     Capillary Refill: Capillary refill takes less than 2 seconds.  Neurological:     Mental Status: She is alert and oriented to person, place, and time.     GCS: GCS eye subscore is 4. GCS verbal subscore is 5. GCS motor subscore is 6.   Psychiatric:        Mood and Affect: Mood normal.        Behavior: Behavior normal.        Thought Content: Thought content normal.     ED Results / Procedures / Treatments   Labs (all labs ordered are listed, but only abnormal results are displayed) Labs Reviewed  BASIC METABOLIC PANEL - Abnormal; Notable for the following components:      Result Value   Potassium 2.2 (*)    Chloride 119 (*)    CO2 17 (*)    Creatinine, Ser 0.40 (*)    Calcium 5.5 (*)    All other components within normal limits  COMPREHENSIVE METABOLIC PANEL - Abnormal; Notable for the following components:   Glucose, Bld 111 (*)    All other components within normal limits  RESP PANEL BY RT-PCR (FLU A&B, COVID) ARPGX2  CBC  MAGNESIUM    EKG None  Radiology DG Pelvis 1-2 Views  Result Date: 04/17/2020 CLINICAL DATA:  Fall onto ice, right hip pain EXAM: RIGHT FEMUR 2 VIEWS; PELVIS - 1-2 VIEW COMPARISON:  None. FINDINGS: Acute mildly comminuted fracture of the proximal right femur with medial displacement of the lesser trochanter. Oblique fracture extension to the subtrochanteric aspect of the proximal right femur with up to 1/2 shaft width of medial displacement. Subtrochanteric fracture is anteriorly angulated. Hip and knee joints intact without dislocation. Bony pelvis intact without fracture or diastasis. Incidental note of a 13 mm circumscribed area of sclerosis along the right superior pubic ramus likely a benign bone island. IMPRESSION: 1. Acute mildly comminuted and displaced fracture of the proximal right femur as described. No dislocation. 2. Bony pelvis intact without fracture or diastasis. Electronically Signed   By: Duanne Guess D.O.   On: 04/17/2020 14:17   DG FEMUR, MIN 2 VIEWS RIGHT  Result Date: 04/17/2020 CLINICAL DATA:  Fall onto ice, right hip pain EXAM: RIGHT FEMUR 2 VIEWS; PELVIS - 1-2 VIEW COMPARISON:  None. FINDINGS: Acute mildly comminuted fracture of the proximal right femur with  medial displacement of the lesser trochanter. Oblique fracture extension to the subtrochanteric aspect of the proximal right femur with up to 1/2 shaft width of medial displacement. Subtrochanteric fracture is anteriorly angulated. Hip and knee joints intact without dislocation. Bony pelvis intact without fracture or diastasis. Incidental note of a 13 mm circumscribed area of sclerosis along the right superior pubic ramus likely a benign bone island. IMPRESSION: 1. Acute mildly comminuted and displaced fracture of the proximal right femur as described. No dislocation. 2. Bony pelvis intact without fracture or diastasis. Electronically Signed   By: Duanne Guess D.O.  On: 04/17/2020 14:17    Procedures Procedures (including critical care time)  Medications Ordered in ED Medications  chlorhexidine (HIBICLENS) 4 % liquid 4 application (has no administration in time range)  morphine 4 MG/ML injection 4 mg (4 mg Intravenous Given 04/17/20 1337)  ondansetron (ZOFRAN) injection 4 mg (4 mg Intravenous Given 04/17/20 1341)    ED Course  I have reviewed the triage vital signs and the nursing notes.  Pertinent labs & imaging results that were available during my care of the patient were reviewed by me and considered in my medical decision making (see chart for details).  Clinical Course as of 04/17/20 1617  Tue Apr 17, 2020  1616 Spoke with Earney Hamburg, PA and patient will be admitted to orthopedic services for IM nail placement. [GG]    Clinical Course User Index [GG] Lorelee New, PA-C   MDM Rules/Calculators/A&P                          Janiylah Hannis was evaluated in Emergency Department on 04/17/2020 for the symptoms described in the history of present illness. She was evaluated in the context of the global COVID-19 pandemic, which necessitated consideration that the patient might be at risk for infection with the SARS-CoV-2 virus that causes COVID-19. Institutional protocols and  algorithms that pertain to the evaluation of patients at risk for COVID-19 are in a state of rapid change based on information released by regulatory bodies including the CDC and federal and state organizations. These policies and algorithms were followed during the patient's care in the ED.  I personally reviewed patient's medical chart and all notes from triage and staff during today's encounter. I have also ordered and reviewed all labs and imaging that I felt to be medically necessary in the evaluation of this patient's complaints and with consideration of their with their physical exam. If needed, translation services were available and utilized.   Concern for femoral fracture.  Will obtain imaging of hip and femur.  We will also obtain basic laboratory work-up and COVID-19 screening test given suspicion that she may ultimately have to go to the OR for IM nail placement.  BMP is reviewed which revealed critically low potassium 2.2 and critically low calcium of 5.5.  No obvious explanation.  Patient states that she takes supplements at home, but they consist of a multivitamin, vitamin B, and magnesium.  We will repeat CMP and also added magnesium.  Repeat CMP confirms that this was laboratory error.  Potassium and calcium within normal limits.  Magnesium also within normal limits.  Spoke with Earney Hamburg, PA and patient will be admitted to orthopedic services for IM nail placement.    Final Clinical Impression(s) / ED Diagnoses Final diagnoses:  Fall, initial encounter    Rx / DC Orders ED Discharge Orders    None       Lorelee New, PA-C 04/17/20 1617    Horton, Clabe Seal, DO 04/18/20 [id number]

## 2020-04-17 NOTE — Progress Notes (Signed)
Orthopedic Tech Progress Note Patient Details:  Laurie Atkins 06-23-1960 [id number] RN stated patient has gone to SURGERY.  Patient ID: Laurie Atkins, female   DOB: [birth year], 60 y.o.   MRN: [id number]   [name] 04/17/2020, 5:40 PM

## 2020-04-17 NOTE — Anesthesia Postprocedure Evaluation (Signed)
Anesthesia Post Note  Patient: Laurie Atkins  Procedure(s) Performed: INTRAMEDULLARY (IM) NAIL FEMORAL (Right Leg Upper)     Patient location during evaluation: PACU Anesthesia Type: General Level of consciousness: awake and alert Pain management: pain level controlled Vital Signs Assessment: post-procedure vital signs reviewed and stable Respiratory status: spontaneous breathing, nonlabored ventilation, respiratory function stable and patient connected to nasal cannula oxygen Cardiovascular status: blood pressure returned to baseline and stable Postop Assessment: no apparent nausea or vomiting Anesthetic complications: no   No complications documented.  Last Vitals:  Vitals:   04/17/20 2055 04/17/20 2111  BP: 121/64 111/66  Pulse:  82  Resp:  16  Temp: 36.9 C 36.6 C  SpO2:  95%    Last Pain:  Vitals:   04/17/20 2055  TempSrc:   PainSc: 4                  Grason Brailsford COKER

## 2020-04-17 NOTE — Op Note (Signed)
Date of Surgery: 04/17/2020  INDICATIONS: Ms. Furuta is a 60 y.o.-year-old female who was involved in a fall on the ice and sustained a right femur subtrochanteric fracture. The risks and benefits of the procedure discussed with the patient prior to the procedure and all questions were answered; consent was obtained.  PREOPERATIVE DIAGNOSIS:  right subtrochanteric femur fracture  POSTOPERATIVE DIAGNOSIS: Same  PROCEDURE:  right femur percutaneous reduction and intramedullary nailing.  CPT 56256  SURGEON: Maryan Rued, M.D.  ASSISTANT: Dion Saucier, PA-C  Assistant attestation: PA Joycelyn Seller was utilized throughout the procedure for positioning and prepping the patient, manipulating the femur, achieving anatomic reduction, and passing intramedullary nail with proximal and distal fixation.  He was also utilized for closure.  ANESTHESIA:  general  IV FLUIDS AND URINE: See anesthesia record.  ESTIMATED BLOOD LOSS: 150 mL.  IMPLANTS:  Synthes TFN A 9 x 380 mm 85 mm proximal screw  2 distal 5.0 interlocks   DRAINS: None.  COMPLICATIONS: None.  DESCRIPTION OF PROCEDURE: The patient was brought to the operating room and placed supine on the operating table.  The patient's leg had been signed prior to the procedure and this was documented.  The patient had the anesthesia placed by the anesthesiologist.  The prep verification and incision time-outs were performed to confirm that this was the correct patient, site, side and location. The patient had an SCD on the opposite lower extremity. The patient did receive antibiotics prior to the incision and was re-dosed during the procedure as needed at indicated intervals.  The patient had the lower extremity prepped and draped in the standard surgical fashion.  Due to the tremendous flexion deformity of the proximal segment we did elect to go ahead with a percutaneous open fixation technique.  A direct lateral approach at the fracture site was  established.  Fluoroscopy was used to determine the level of this incision.  Dissection was then carried down through skin and subcutaneous tissue to the iliotibial band fascia.  This was then incised in line with the skin incision.  Deep retractors were placed.  We then used a reduction clamp to anatomically align the subtrochanteric fracture fragments.  Once we were satisfied with this we then moved our attention to placing the intramedullary nail.  .At the hip, the starting point was first found with the guide wire and this was inserted under fluoroscopic visualization. The guide wire was placed partially down into the femur and then the incision was made in the skin and subcutaneous tissue to the fascia and then the opening reamer was placed over this.  All radiographs were confirmed throughout the procedure on both AP and lateral views. The ball-tipped guide wire was then placed down to the distal portion of the femur in the proper location and the measuring guide was used to measure off of this after the femur was brought out to length. Sequential reaming was then performed to give some chatter.  The nail itself was then inserted over the wire and then the guide wire was removed. The proximal interlocking screws were placed first starting with the inferior one to allow for proper placement at the calcar.  Again, all radiographs were confirmed on both AP and lateral views.  The proximal screws were placed through the jig in the standard fashion, first incising the skin, subcutaneous tissue, and fascia, then spreading with a clamp.  The drill was placed and confirmed fluoroscopically, followed by measuring, then placing the screws by hand.  Attention  was then turned to the distal interlocking screws. The lateral x-ray was used to get the perfect circles view, then an incision was made through the skin and subcutaneous tissue and fascia followed by drilling, measuring with a depth gauge, then placing the  screws by hand. Final x-rays were taken on both AP and lateral views to confirm all of the screw placements.  An internal rotation view was taken at the femoral neck to confirm integrity of the neck.  The wounds were copiously irrigated with saline and then the deep fascia was closed with 0 Vicryl figure-of-eight interrupted sutures. The skin was re-approximated with staples. The wounds were cleaned and dried a final time and a sterile dressing was placed. The patient was then transferred to a bed and left the operating room in stable condition.  All counts were correct at the end of the case.    POSTOPERATIVE PLAN: Ms. Hobbins will be 50 % PWB and will return in 2  weeks for suture removal;  she will also obtaine x-rays at that time.  Ms. Widjaja will receive DVT prophylaxis based on other medications, activity level, and risk ratio of bleeding to thrombosis.  She will be admitted postoperatively for routine pain control and physical therapy.  She will be placed on aspirin 81 mg, once per day, x6 weeks.

## 2020-04-17 NOTE — ED Notes (Signed)
Patient transported to X-ray 

## 2020-04-17 NOTE — Anesthesia Procedure Notes (Signed)
Procedure Name: Intubation Date/Time: 04/17/2020 6:34 PM Performed by: Janace Litten, CRNA Pre-anesthesia Checklist: Patient identified, Emergency Drugs available, Suction available and Patient being monitored Patient Re-evaluated:Patient Re-evaluated prior to induction Oxygen Delivery Method: Circle System Utilized Preoxygenation: Pre-oxygenation with 100% oxygen Induction Type: IV induction Ventilation: Mask ventilation without difficulty Laryngoscope Size: Mac and 3 Grade View: Grade I Tube type: Oral Tube size: 7.5 mm Number of attempts: 1 Airway Equipment and Method: Stylet Placement Confirmation: ETT inserted through vocal cords under direct vision,  positive ETCO2 and breath sounds checked- equal and bilateral Secured at: 21 cm Tube secured with: Tape Dental Injury: Teeth and Oropharynx as per pre-operative assessment

## 2020-04-17 NOTE — Anesthesia Preprocedure Evaluation (Addendum)
Anesthesia Evaluation  Patient identified by MRN, date of birth, ID band Patient awake    Reviewed: Allergy & Precautions, H&P , NPO status , Patient's Chart, lab work & pertinent test results  Airway Mallampati: II  TM Distance: >3 FB Neck ROM: Full    Dental no notable dental hx. (+) Teeth Intact, Dental Advisory Given   Pulmonary former smoker,    Pulmonary exam normal breath sounds clear to auscultation       Cardiovascular negative cardio ROS Normal cardiovascular exam Rhythm:Regular Rate:Normal     Neuro/Psych negative neurological ROS  negative psych ROS   GI/Hepatic negative GI ROS, Neg liver ROS,   Endo/Other  negative endocrine ROS  Renal/GU negative Renal ROS  negative genitourinary   Musculoskeletal negative musculoskeletal ROS (+)   Abdominal   Peds negative pediatric ROS (+)  Hematology negative hematology ROS (+)   Anesthesia Other Findings   Reproductive/Obstetrics negative OB ROS                            Anesthesia Physical Anesthesia Plan  ASA: II  Anesthesia Plan: General   Post-op Pain Management:    Induction: Intravenous  PONV Risk Score and Plan: 3 and Ondansetron, Dexamethasone, Midazolam and Treatment may vary due to age or medical condition  Airway Management Planned: Oral ETT  Additional Equipment:   Intra-op Plan:   Post-operative Plan: Extubation in OR  Informed Consent: I have reviewed the patients History and Physical, chart, labs and discussed the procedure including the risks, benefits and alternatives for the proposed anesthesia with the patient or authorized representative who has indicated his/her understanding and acceptance.     Dental advisory given  Plan Discussed with: CRNA and Surgeon  Anesthesia Plan Comments:        Anesthesia Quick Evaluation

## 2020-04-17 NOTE — Brief Op Note (Signed)
04/17/2020  8:08 PM  PATIENT:  Laurie Atkins  60 y.o. female  PRE-OPERATIVE DIAGNOSIS:  Right femur fracture  POST-OPERATIVE DIAGNOSIS:  Right femur fracture  PROCEDURE:  Procedure(s): INTRAMEDULLARY (IM) NAIL FEMORAL (Right)  SURGEON:  Surgeon(s) and Role:    * Yolonda Kida, MD - Primary  PHYSICIAN ASSISTANT: Dion Saucier, PA-C   ANESTHESIA:   general  EBL:  150 mL   BLOOD ADMINISTERED:none  DRAINS: none   LOCAL MEDICATIONS USED:  NONE  SPECIMEN:  No Specimen  DISPOSITION OF SPECIMEN:  N/A  COUNTS:  YES  TOURNIQUET:  * No tourniquets in log *  DICTATION: .Note written in EPIC  PLAN OF CARE: Admit to inpatient   PATIENT DISPOSITION:  PACU - hemodynamically stable.   Delay start of Pharmacological VTE agent (>24hrs) due to surgical blood loss or risk of bleeding: not applicable

## 2020-04-17 NOTE — Progress Notes (Signed)
Patient admitted to room. Alert and oriented. C/o pain to rt leg. Skin dry and warm to touch. No issue noted.

## 2020-04-17 NOTE — Consult Note (Signed)
Reason for Consult:Right femur fx Referring Physician: K Horton Time called: 1429 Time at bedside: 1429   Laurie Atkins is an 60 y.o. female.  HPI: Laurie Atkins was walking into work when she slipped on some ice and fell. She had immediate right hip pain and could not get up. She was brought to the ED where x-rays showed a right subtroch femur fx and orthopedic surgery was consulted. She c/o localized pain to her hip. 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  No past medical history on file.  Past Surgical History:  Procedure Laterality Date  . WISDOM TOOTH EXTRACTION  2020    Family History  Problem Relation Age of Onset  . Arrhythmia Mother   . Heart Problems Father     Social History:  reports that she has quit smoking. She has never used smokeless tobacco. No history on file for alcohol use and drug use.  Allergies: No Known Allergies  Medications: I have reviewed the patient's current medications.  Results for orders placed or performed during the hospital encounter of 04/17/20 (from the past 48 hour(s))  CBC     Status: None   Collection Time: 04/17/20  1:12 PM  Result Value Ref Range   WBC 7.3 4.0 - 10.5 K/uL   RBC 4.63 3.87 - 5.11 MIL/uL   Hemoglobin 12.8 12.0 - 15.0 g/dL   HCT 86.5 78.4 - 69.6 %   MCV 85.7 80.0 - 100.0 fL   MCH 27.6 26.0 - 34.0 pg   MCHC 32.2 30.0 - 36.0 g/dL   RDW 29.5 28.4 - 13.2 %   Platelets 183 150 - 400 K/uL   nRBC 0.0 0.0 - 0.2 %    Comment: Performed at [facility], [address] [place], [place] [address]  Basic metabolic panel     Status: Abnormal   Collection Time: 04/17/20  1:12 PM  Result Value Ref Range   Sodium 142 135 - 145 mmol/L   Potassium 2.2 (LL) 3.5 - 5.1 mmol/L    Comment: CRITICAL RESULT CALLED TO, READ BACK BY AND VERIFIED WITH: LASHER,G RN @1434  ON BY FLEMINGS    Chloride 119 (H) 98 - 111 mmol/L   CO2 17 (L) 22 - 32 mmol/L   Glucose, Bld 77 70 - 99 mg/dL    Comment: Glucose reference  range applies only to samples taken after fasting for at least 8 hours.   BUN 8 6 - 20 mg/dL   Creatinine, Ser [id number] (L) 0.44 - 1.00 mg/dL   Calcium 5.5 (LL) 8.9 - 10.3 mg/dL    Comment: CRITICAL RESULT CALLED TO, READ BACK BY AND VERIFIED WITH: LASHER,G RN @1434  ON 4.03 BY FLEMINGS    GFR, Estimated >60 >60 mL/min    Comment: (NOTE) Calculated using the CKD-EPI Creatinine Equation (2021)    Anion gap 6 5 - 15    Comment: Performed at [facility], [address], [place], [address] [place]  Resp Panel by RT-PCR (Flu A&B, Covid) Nasopharyngeal Swab     Status: None   Collection Time: 04/17/20  1:12 PM   Specimen: Nasopharyngeal Swab; Nasopharyngeal(NP) swabs in vial transport medium  Result Value Ref Range   SARS Coronavirus 2 by RT PCR NEGATIVE NEGATIVE    Comment: (NOTE) SARS-CoV-2 target nucleic acids are NOT DETECTED.  The SARS-CoV-2 RNA is generally detectable in upper respiratory specimens during the acute phase of infection. The lowest concentration of SARS-CoV-2 viral copies this assay  can detect is 138 copies/mL. A negative result does not preclude SARS-Cov-2 infection and should not be used as the sole basis for treatment or other patient management decisions. A negative result may occur with  improper specimen collection/handling, submission of specimen other than nasopharyngeal swab, presence of viral mutation(s) within the areas targeted by this assay, and inadequate number of viral copies(<138 copies/mL). A negative result must be combined with clinical observations, patient history, and epidemiological information. The expected result is Negative.  Fact Sheet for Patients:  BloggerCourse.com  Fact Sheet for Healthcare Providers:  SeriousBroker.it  This test is no t yet approved or cleared by the [place] FDA and  has been authorized for detection and/or diagnosis of SARS-CoV-2 by FDA under an  Emergency Use Authorization (EUA). This EUA will remain  in effect (meaning this test can be used) for the duration of the COVID-19 declaration under Section 564(b)(1) of the Act, 21 U.S.C.section 360bbb-3(b)(1), unless the authorization is terminated  or revoked sooner.       Influenza A by PCR NEGATIVE NEGATIVE   Influenza B by PCR NEGATIVE NEGATIVE    Comment: (NOTE) The Xpert Xpress SARS-CoV-2/FLU/RSV plus assay is intended as an aid in the diagnosis of influenza from Nasopharyngeal swab specimens and should not be used as a sole basis for treatment. Nasal washings and aspirates are unacceptable for Xpert Xpress SARS-CoV-2/FLU/RSV testing.  Fact Sheet for Patients: BloggerCourse.com  Fact Sheet for Healthcare Providers: SeriousBroker.it  This test is not yet approved or cleared by the [place] FDA and has been authorized for detection and/or diagnosis of SARS-CoV-2 by FDA under an Emergency Use Authorization (EUA). This EUA will remain in effect (meaning this test can be used) for the duration of the COVID-19 declaration under Section 564(b)(1) of the Act, 21 U.S.C. section 360bbb-3(b)(1), unless the authorization is terminated or revoked.  Performed at [facility], [address], [place], [place] [address]     DG Pelvis 1-2 Views  Result Date: 04/17/2020 CLINICAL DATA:  Fall onto ice, right hip pain EXAM: RIGHT FEMUR 2 VIEWS; PELVIS - 1-2 VIEW COMPARISON:  None. FINDINGS: Acute mildly comminuted fracture of the proximal right femur with medial displacement of the lesser trochanter. Oblique fracture extension to the subtrochanteric aspect of the proximal right femur with up to 1/2 shaft width of medial displacement. Subtrochanteric fracture is anteriorly angulated. Hip and knee joints intact without dislocation. Bony pelvis intact without fracture or diastasis. Incidental note of a 13 mm circumscribed area of  sclerosis along the right superior pubic ramus likely a benign bone island. IMPRESSION: 1. Acute mildly comminuted and displaced fracture of the proximal right femur as described. No dislocation. 2. Bony pelvis intact without fracture or diastasis. Electronically Signed   By: Duanne Guess D.O.   On: 04/17/2020 14:17   DG FEMUR, MIN 2 VIEWS RIGHT  Result Date: 04/17/2020 CLINICAL DATA:  Fall onto ice, right hip pain EXAM: RIGHT FEMUR 2 VIEWS; PELVIS - 1-2 VIEW COMPARISON:  None. FINDINGS: Acute mildly comminuted fracture of the proximal right femur with medial displacement of the lesser trochanter. Oblique fracture extension to the subtrochanteric aspect of the proximal right femur with up to 1/2 shaft width of medial displacement. Subtrochanteric fracture is anteriorly angulated. Hip and knee joints intact without dislocation. Bony pelvis intact without fracture or diastasis. Incidental note of a 13 mm circumscribed area of sclerosis along the right superior pubic ramus likely a benign bone island. IMPRESSION: 1. Acute mildly comminuted  and displaced fracture of the proximal right femur as described. No dislocation. 2. Bony pelvis intact without fracture or diastasis. Electronically Signed   By: Duanne Guess D.O.   On: 04/17/2020 14:17    Review of Systems  HENT: Negative for ear discharge, ear pain, hearing loss and tinnitus.   Eyes: Negative for photophobia and pain.  Respiratory: Negative for cough and shortness of breath.   Cardiovascular: Negative for chest pain.  Gastrointestinal: Negative for abdominal pain, nausea and vomiting.  Genitourinary: Negative for dysuria, flank pain, frequency and urgency.  Musculoskeletal: Positive for arthralgias (Right hip pain). Negative for back pain, myalgias and neck pain.  Neurological: Negative for dizziness and headaches.  Hematological: Does not bruise/bleed easily.  Psychiatric/Behavioral: The patient is not nervous/anxious.    Blood pressure  (!) 142/79, pulse 78, temperature 98.3 F (36.8 C), temperature source Oral, resp. rate 12, height 5\' 4"  (1.626 m), weight 62.1 kg, SpO2 99 %. Physical Exam Constitutional:      General: She is not in acute distress.    Appearance: She is well-developed and well-nourished. She is not diaphoretic.  HENT:     Head: Normocephalic and atraumatic.  Eyes:     General: No scleral icterus.       Right eye: No discharge.        Left eye: No discharge.     Conjunctiva/sclera: Conjunctivae normal.  Cardiovascular:     Rate and Rhythm: Normal rate and regular rhythm.  Pulmonary:     Effort: Pulmonary effort is normal. No respiratory distress.  Musculoskeletal:     Cervical back: Normal range of motion.     Comments: RLE No traumatic wounds, ecchymosis, or rash  Mod TTP hip  No knee or ankle effusion  Knee stable to varus/ valgus and anterior/posterior stress  Sens DPN, SPN, TN intact  Motor EHL, ext, flex, evers 5/5  DP 2+, PT 2+, No significant edema  Skin:    General: Skin is warm and dry.  Neurological:     Mental Status: She is alert.  Psychiatric:        Mood and Affect: Mood and affect normal.        Behavior: Behavior normal.     Assessment/Plan: Right femur fx -- Plan IMN today by Dr. . Please keep NPO. Nicotine use BMP abnormalities -- Will need medical admit and workup if these are true values. Stat repeat pending now.    Aundria Rud, PA-C Orthopedic Surgery [phone] 04/17/2020, 2:48 PM

## 2020-04-17 NOTE — ED Triage Notes (Signed)
BIB EMS for fall; she slipped on ice and landed on her right hip. Pt was on the road for about 20 minutes. Right leg rotated and shortened, upper thigh swollen. Good pedal pulses, good circulation. Not on blood thinner. A&Ox4.

## 2020-04-17 NOTE — Plan of Care (Signed)
  Problem: Activity: Goal: Risk for activity intolerance will decrease Outcome: Progressing   Problem: Coping: Goal: Level of anxiety will decrease Outcome: Progressing   Problem: Pain Managment: Goal: General experience of comfort will improve Outcome: Progressing   Problem: Safety: Goal: Ability to remain free from injury will improve Outcome: Progressing   Problem: Skin Integrity: Goal: Risk for impaired skin integrity will decrease Outcome: Progressing   

## 2020-04-17 NOTE — OR Nursing (Signed)
Care of patient assumed at 1906. 

## 2020-04-18 ENCOUNTER — Encounter (HOSPITAL_COMMUNITY): Payer: Self-pay | Admitting: Orthopedic Surgery

## 2020-04-18 LAB — BASIC METABOLIC PANEL
Anion gap: 11 (ref 5–15)
BUN: 16 mg/dL (ref 6–20)
CO2: 22 mmol/L (ref 22–32)
Calcium: 8.5 mg/dL — ABNORMAL LOW (ref 8.9–10.3)
Chloride: 102 mmol/L (ref 98–111)
Creatinine, Ser: 0.89 mg/dL (ref 0.44–1.00)
GFR, Estimated: >60 mL/min (ref >60)
Glucose, Bld: 145 mg/dL — ABNORMAL HIGH (ref 70–99)
Potassium: 4.6 mmol/L (ref 3.5–5.1)
Sodium: 135 mmol/L (ref 135–145)

## 2020-04-18 LAB — CBC
HCT: 29.4 % — ABNORMAL LOW (ref 36.0–46.0)
Hemoglobin: 9.9 g/dL — ABNORMAL LOW (ref 12.0–15.0)
MCH: 29.1 pg (ref 26.0–34.0)
MCHC: 33.7 g/dL (ref 30.0–36.0)
MCV: 86.5 fL (ref 80.0–100.0)
Platelets: 203 10*3/uL (ref 150–400)
RBC: 3.4 MIL/uL — ABNORMAL LOW (ref 3.87–5.11)
RDW: 13.2 % (ref 11.5–15.5)
WBC: 9.9 10*3/uL (ref 4.0–10.5)
nRBC: 0 % (ref 0.0–0.2)

## 2020-04-18 LAB — HIV ANTIBODY (ROUTINE TESTING W REFLEX): HIV Screen 4th Generation wRfx: NONREACTIVE

## 2020-04-18 NOTE — Progress Notes (Addendum)
Subjective: Laurie Atkins is a 60 year old female 1 day status post a right IM femur nail for a subtrochanteric fracture.  She fell while walking in the parking lot at work on 04/17/2020.  Today,  Patient reports pain as mild to moderate.  She reports minimal pain while at rest.  He does report when she tried to get up with physical therapy she had difficulty and some nausea.  She reports no significant pain.  She has not been taking a lot of oxycodone as she did have a little bit of nausea.  She reports the pain is tolerable at rest.  She reports some swelling.  She denies numbness or tingling.  She denies fever or chills.   She lives at home with her daughter and husband.  She reports that when she goes home should be alone during the day as her child would be back at school and her husband works during the day.  Objective:   VITALS:   Vitals:   04/17/20 2111 04/18/20 0356 04/18/20 0747 04/18/20 1500  BP: 111/66 120/69 131/79 129/77  Pulse: 82 83 91 90  Resp: 16 16 18 18   Temp: 97.9 F (36.6 C) 98.4 F (36.9 C) 98.8 F (37.1 C) 98.3 F (36.8 C)  TempSrc:  Oral Oral Oral  SpO2: 95% 97% 100% 100%  Weight:      Height:       Constitutional: General Appearance: healthy-appearing, well-nourished, and well-developed. Level of Distress: no acute distress. Eyes: Lens (normal) clear: both eyes. Head: Head: normocephalic and atraumatic. Lungs: Respiratory effort: no dyspnea. Skin: Inspection and palpation: no rash, lesions Neurologic: Cranial Nerves: grossly intact. Sensation: grossly intact. Psychiatric: Insight: good judgement and insight. Mental Status: normal mood and affect and active and alert. Orientation: to time, place, and person.    Right Lower Extremity:  Neurologically intact Neurovascular intact Sensation intact distally Intact pulses distally Dorsiflexion/Plantar flexion intact Incision: dressing C/D/I except for one incision had noted bleeding into her dressing.  Ioban., adaptic 4x4 dressings otherwise intact.  Swelling of the proximal thigh, compartment soft Knee ROM 0-60 limited by hip pain.   Lab Results  Component Value Date   WBC 9.9 04/18/2020   HGB 9.9 (L) 04/18/2020   HCT 29.4 (L) 04/18/2020   MCV 86.5 04/18/2020   PLT 203 04/18/2020   BMET    Component Value Date/Time   NA 135 04/18/2020 0537   NA 141 09/22/2018 1141   K 4.6 04/18/2020 0537   CL 102 04/18/2020 0537   CO2 22 04/18/2020 0537   GLUCOSE 145 (H) 04/18/2020 0537   BUN 16 04/18/2020 0537   BUN 12 09/22/2018 1141   CREATININE 0.89 04/18/2020 0537   CALCIUM 8.5 (L) 04/18/2020 0537   GFRNONAA >60 04/18/2020 0537   GFRAA 118 09/22/2018 1141     Assessment/Plan: 1 Day Post-Op   Active Problems:   Closed fracture of right femur (HCC)   Closed displaced subtrochanteric fracture of right femur (HCC)   Advance diet Up with therapy  Dressing on middle lateral thigh incision changed today.  Dressings can be changed as needed if significant drainage with Adaptic, 4 x 4's, and tape.  Recommend continuing to work with physical therapy.  Current PT recommendations are for home health therapy with supervision for mobility and getting out of bed.  We will look to see if she has any improved motion and mobility with physical therapy tomorrow.  Could consider discharge tomorrow after PT. Patient is a little bit worried  as she would be at home alone during the day potentially.  Will discuss with Dr. Aundria Rud.   Pain medications as needed.  Advised to work on ankle and knee range of motion as tolerated.  Weightbearing Status: 50% weightbearing to the RLE DVT Prophylaxis: Aspirin 81mg     04/18/2020, 3:46 PM  04/20/2020 PA-C  Physician Assistant with Dr. Dion Saucier [place]

## 2020-04-18 NOTE — Evaluation (Signed)
Physical Therapy Evaluation Patient Details Name: Laurie Atkins MRN: [id number] DOB: 02/11/1961 Today's Date: 04/18/2020   History of Present Illness  Patient is a 60 y/o female with mechanical fall resulting in R femur fx. Patient s/p IM nail R femur on 1/18. PMH significant for aortic atherosclerosis.  Clinical Impression  PTA, patient was independent and lives with husband and child (71 y/o). Patient requires minA for bed mobility with assistance for R LE in/out of bed. ModA required for sit to stand transfer with RW due to pain and weakness. Patient tends to keep R LE internally rotated in bed and when standing, encouraged patient on neutral positioning when in bed and standing. Patient presents with decreased activity tolerance, impaired balance, generalized weakness, and impaired functional mobility. Patient will benefit from skilled PT services during acute stay to address listed deficits. Recommend HHPT at this time following d/c pending progress.     Follow Up Recommendations Home health PT;Supervision for mobility/OOB (update pending progress)    Equipment Recommendations  Rolling Joffrey Kerce with 5" wheels;3in1 (PT)    Recommendations for Other Services OT consult     Precautions / Restrictions Precautions Precautions: Fall Restrictions Weight Bearing Restrictions: Yes RLE Weight Bearing: Partial weight bearing RLE Partial Weight Bearing Percentage or Pounds: 50% Other Position/Activity Restrictions: patient tends to keep R LE internally rotated, encouraged neutral positioning in bed      Mobility  Bed Mobility Overal bed mobility: Needs Assistance Bed Mobility: Supine to Sit;Sit to Supine     Supine to sit: Min assist Sit to supine: Min assist   General bed mobility comments: assist required for R LE getting in/out of bed. Patient hesitant about bearing weight due to "I feel like I shouldn't get up cause I will mess up my leg again". Assured her it was safe to get OOB and  educated her on benefits of WB on bone healing.    Transfers Overall transfer level: Needs assistance Equipment used: Rolling Mayreli Alden (2 wheeled) Transfers: Sit to/from Stand Sit to Stand: Mod assist         General transfer comment: modA for boost up, patient keeps R LE internally rotated and requires assistance to achieve neutral position. Cues for hand placement. Fearful of falling  Ambulation/Gait             General Gait Details: unable due to pain  Stairs            Wheelchair Mobility    Modified Rankin (Stroke Patients Only)       Balance Overall balance assessment: Needs assistance Sitting-balance support: No upper extremity supported;Feet supported Sitting balance-Leahy Scale: Fair     Standing balance support: Bilateral upper extremity supported;During functional activity Standing balance-Leahy Scale: Poor Standing balance comment: reliant on UE support                             Pertinent Vitals/Pain Pain Assessment: Faces Faces Pain Scale: Hurts even more Pain Location: R LE Pain Descriptors / Indicators: Grimacing;Guarding Pain Intervention(s): Monitored during session;Repositioned    Home Living Family/patient expects to be discharged to:: Private residence Living Arrangements: Spouse/significant other Available Help at Discharge: Family;Available PRN/intermittently Type of Home: House Home Access: Stairs to enter Entrance Stairs-Rails: Right;Left Entrance Stairs-Number of Steps: 4-5 Home Layout: Two level;Able to live on main level with bedroom/bathroom Home Equipment: Crutches      Prior Function Level of Independence: Independent  Hand Dominance        Extremity/Trunk Assessment   Upper Extremity Assessment Upper Extremity Assessment: Overall WFL for tasks assessed    Lower Extremity Assessment Lower Extremity Assessment: Generalized weakness;RLE deficits/detail RLE: Unable to fully  assess due to pain       Communication   Communication: No difficulties  Cognition Arousal/Alertness: Awake/alert Behavior During Therapy: Anxious Overall Cognitive Status: Within Functional Limits for tasks assessed                                        General Comments General comments (skin integrity, edema, etc.): husband present and helpful. Patient is fearful of falling and anxious throughout session. Complaints of nausea following standing trial    Exercises     Assessment/Plan    PT Assessment Patient needs continued PT services  PT Problem List Decreased strength;Decreased range of motion;Decreased activity tolerance;Decreased balance;Decreased mobility       PT Treatment Interventions DME instruction;Gait training;Stair training;Functional mobility training;Therapeutic activities;Therapeutic exercise;Balance training;Patient/family education    PT Goals (Current goals can be found in the Care Plan section)  Acute Rehab PT Goals Patient Stated Goal: to walk PT Goal Formulation: With patient Time For Goal Achievement: 05/02/20 Potential to Achieve Goals: Good    Frequency Min 5X/week   Barriers to discharge        Co-evaluation               AM-PAC PT "6 Clicks" Mobility  Outcome Measure Help needed turning from your back to your side while in a flat bed without using bedrails?: A Little Help needed moving from lying on your back to sitting on the side of a flat bed without using bedrails?: A Little Help needed moving to and from a bed to a chair (including a wheelchair)?: A Lot Help needed standing up from a chair using your arms (e.g., wheelchair or bedside chair)?: A Lot Help needed to walk in hospital room?: A Lot Help needed climbing 3-5 steps with a railing? : A Lot 6 Click Score: 14    End of Session Equipment Utilized During Treatment: Gait belt Activity Tolerance: Patient limited by pain Patient left: in bed;with call  bell/phone within reach;with family/visitor present Nurse Communication: Mobility status PT Visit Diagnosis: Unsteadiness on feet (R26.81);Muscle weakness (generalized) (M62.81);History of falling (Z91.81);Difficulty in walking, not elsewhere classified (R26.2)    Time: 2440-1027 PT Time Calculation (min) (ACUTE ONLY): 43 min   Charges:   PT Evaluation $PT Eval Low Complexity: 1 Low PT Treatments $Therapeutic Activity: 23-37 mins        Dorianne Perret A. Dan Humphreys PT, DPT Acute Rehabilitation Services Pager [phone] Office [phone]   Elissa Lovett 04/18/2020, 1:42 PM

## 2020-04-18 NOTE — Plan of Care (Signed)
?  Problem: Activity: ?Goal: Risk for activity intolerance will decrease ?Outcome: Progressing ?  ?Problem: Safety: ?Goal: Ability to remain free from injury will improve ?Outcome: Progressing ?  ?Problem: Pain Managment: ?Goal: General experience of comfort will improve ?Outcome: Progressing ?  ?

## 2020-04-18 NOTE — Evaluation (Signed)
Occupational Therapy Evaluation Patient Details Name: Laurie Atkins MRN: [id number] DOB: Mar 03, 1961 Today's Date: 04/18/2020    History of Present Illness Patient is a 60 y/o female with mechanical fall resulting in R femur fx. Patient s/p IM nail R femur on 1/18. PMH significant for aortic atherosclerosis.   Clinical Impression   Pt presents with decline in function and safety with ADLs and ADL mobility with impaired balance and endurance; pt limited by R LE pain and eval limited as pt declined sitting OOB or OOB activity. Pt lives at home with her husband and was Ind with ADLs/selfcare, home mgt, mobility, was driving and working. Pt currently requires extensive assist with ADLs/selfcare and mobility and would benefit from acute OT services to address impairments to maixmize level of function and safety    Follow Up Recommendations  Home health OT    Equipment Recommendations  3 in 1 bedside commode;Other (comment) (RW, ADL A/E kit)    Recommendations for Other Services       Precautions / Restrictions Precautions Precautions: Fall Restrictions Weight Bearing Restrictions: Yes RLE Weight Bearing: Partial weight bearing RLE Partial Weight Bearing Percentage or Pounds: 50% Other Position/Activity Restrictions: patient tends to keep R LE internally rotated, encouraged neutral positioning in bed      Mobility Bed Mobility Overal bed mobility: Needs Assistance Bed Mobility: Supine to Sit;Sit to Supine     Supine to sit: Min assist Sit to supine: Min assist   General bed mobility comments: pt declined due to anticipatory pain, min A per PT note    Transfers Overall transfer level: Needs assistance Equipment used: Rolling walker (2 wheeled) Transfers: Sit to/from Stand Sit to Stand: Mod assist         General transfer comment: pt declined due to anticipatory pain, mod A per PT note    Balance Overall balance assessment: Needs assistance Sitting-balance support: No  upper extremity supported;Feet supported Sitting balance-Leahy Scale: Fair     Standing balance support: Bilateral upper extremity supported;During functional activity Standing balance-Leahy Scale: Poor Standing balance comment: reliant on UE support                           ADL either performed or assessed with clinical judgement   ADL Overall ADL's : Needs assistance/impaired Eating/Feeding: Set up;Independent;Sitting;Bed level   Grooming: Wash/dry hands;Wash/dry face;Set up;Bed level   Upper Body Bathing: Set up;Supervision/ safety;Bed level   Lower Body Bathing: Total assistance   Upper Body Dressing : Set up;Supervision/safety;Bed level   Lower Body Dressing: Total assistance     Toilet Transfer Details (indicate cue type and reason): declined due to pain                 Vision Baseline Vision/History: Wears glasses Wears Glasses: Reading only Patient Visual Report: No change from baseline       Perception     Praxis      Pertinent Vitals/Pain Pain Assessment: 0-10 Pain Score: 5  Faces Pain Scale: Hurts even more Pain Location: R LE Pain Descriptors / Indicators: Aching;Sore;Operative site guarding Pain Intervention(s): Limited activity within patient's tolerance;Monitored during session;Repositioned     Hand Dominance Left   Extremity/Trunk Assessment Upper Extremity Assessment Upper Extremity Assessment: Overall WFL for tasks assessed   Lower Extremity Assessment Lower Extremity Assessment: Defer to PT evaluation RLE: Unable to fully assess due to pain       Communication Communication Communication: No difficulties   Cognition Arousal/Alertness:  Awake/alert Behavior During Therapy: WFL for tasks assessed/performed Overall Cognitive Status: Within Functional Limits for tasks assessed                                     General Comments  husband present and helpful. Patient is fearful of falling and anxious  throughout session. Complaints of nausea following standing trial    Exercises     Shoulder Instructions      Home Living Family/patient expects to be discharged to:: Private residence Living Arrangements: Spouse/significant other Available Help at Discharge: Family;Available PRN/intermittently Type of Home: House Home Access: Stairs to enter [employer] of Steps: 4-5 Entrance Stairs-Rails: Right;Left Home Layout: Two level;Able to live on main level with bedroom/bathroom Alternate Level Stairs-Number of Steps: flight Alternate Level Stairs-Rails: Right Bathroom Shower/Tub: Occupational psychologist: Standard     Home Equipment: Crutches          Prior Functioning/Environment Level of Independence: Independent                 OT Problem List: Impaired balance (sitting and/or standing);Decreased activity tolerance;Decreased knowledge of use of DME or AE;Pain      OT Treatment/Interventions: Self-care/ADL training;Therapeutic exercise;Patient/family education;Therapeutic activities;DME and/or AE instruction    OT Goals(Current goals can be found in the care plan section) Acute Rehab OT Goals Patient Stated Goal: to walk OT Goal Formulation: With patient/family Time For Goal Achievement: 05/02/20 Potential to Achieve Goals: Good ADL Goals Pt Will Perform Grooming: with min guard assist;with supervision;with set-up;sitting Pt Will Perform Upper Body Bathing: with supervision;with set-up;sitting;with caregiver independent in assisting Pt Will Perform Lower Body Bathing: with mod assist;sitting/lateral leans;with caregiver independent in assisting Pt Will Perform Upper Body Dressing: with supervision;with set-up;sitting;with caregiver independent in assisting Pt Will Perform Lower Body Dressing: with mod assist;sitting/lateral leans;sit to/from stand;with adaptive equipment;with caregiver independent in assisting Pt Will Transfer to Toilet: with min  assist;ambulating;stand pivot transfer;regular height toilet;bedside commode;grab bars Pt Will Perform Toileting - Clothing Manipulation and hygiene: with mod assist;sit to/from stand;with caregiver independent in assisting Pt Will Perform Tub/Shower Transfer: with mod assist;with min assist;ambulating;shower seat;3 in 1;rolling walker  OT Frequency: Min 2X/week   Barriers to D/C:            Co-evaluation              AM-PAC OT "6 Clicks" Daily Activity     Outcome Measure Help from another person eating meals?: None Help from another person taking care of personal grooming?: A Little Help from another person toileting, which includes using toliet, bedpan, or urinal?: Total Help from another person bathing (including washing, rinsing, drying)?: A Lot Help from another person to put on and taking off regular upper body clothing?: A Little Help from another person to put on and taking off regular lower body clothing?: Total 6 Click Score: 14   End of Session    Activity Tolerance: Patient limited by pain Patient left: in bed;with call bell/phone within reach;with family/visitor present  OT Visit Diagnosis: Other abnormalities of gait and mobility (R26.89);Muscle weakness (generalized) (M62.81);History of falling (Z91.81);Pain Pain - Right/Left: Right Pain - part of body: Leg                Time: 1553-1620 OT Time Calculation (min): 27 min Charges:  OT General Charges $OT Visit: 1 Visit OT Evaluation $OT Eval Low Complexity: 1 Low OT Treatments $  Therapeutic Activity: 8-22 mins    Emmit Alexanders [facility] 04/18/2020, 4:30 PM

## 2020-04-19 NOTE — TOC CAGE-AID Note (Signed)
Transition of Care [facility]) - CAGE-AID Screening   Patient Details  Name: Laurie Atkins MRN: [id number] Date of Birth: 09/29/1960  Transition of Care [facility]): Etheleen Sia, RN, TRN Phone Number: Trauma Services 04/19/2020, 9:51 AM   CAGE-AID Screening:    Have You Ever Felt You Ought to Cut Down on Your Drinking or Drug Use?: No Have People Annoyed You By [employer] Your Drinking Or Drug Use?: No Have You Felt Bad Or Guilty About Your Drinking Or Drug Use?: No Have You Ever Had a Drink or Used Drugs First Thing In The Morning to Steady Your Nerves or to Get Rid of a Hangover?: No CAGE-AID Score: 0

## 2020-04-19 NOTE — Progress Notes (Signed)
Occupational Therapy Treatment Patient Details Name: Laurie Atkins MRN: [id number] DOB: 1960/10/17 Today's Date: 04/19/2020    History of present illness Patient is a 60 y/o female with mechanical fall resulting in R femur fx. Patient s/p IM nail R femur on 1/18. PMH significant for aortic atherosclerosis.   OT comments  Pt sitting EOB upon arrival with her husband present and assisting her. Pt required encouragement but agreeable to OOB activity. Pt and her husband educated on ADL A/E for home use with video demo instruction. OT will continue to follow acutely to maximize level of function and safety  Follow Up Recommendations  Home health OT (pending progress, may need SNF)    Equipment Recommendations  3 in 1 bedside commode;Other (comment) (RW, ADL A/E kit)    Recommendations for Other Services      Precautions / Restrictions Precautions Precautions: Fall Restrictions Weight Bearing Restrictions: Yes RLE Weight Bearing: Partial weight bearing RLE Partial Weight Bearing Percentage or Pounds: 50%       Mobility Bed Mobility Overal bed mobility: Needs Assistance             General bed mobility comments: sitting EOB on arrival with husband  Transfers Overall transfer level: Needs assistance Equipment used: Rolling walker (2 wheeled) Transfers: Sit to/from [employer] Sit to Stand: Min guard Stand pivot transfers: Min guard       General transfer comment: required extensive encouragement and instruction as patient adamant about doing her way even if it's unsafe and puts her at risk for falls. Unwilling to listen to or follow directions. Cues for hand placement and reaching BSC prior to sitting down with poor follow through. Educated patient on risk of falling and techniques to reduce the risk for falling    Balance Overall balance assessment: Needs assistance Sitting-balance support: No upper extremity supported;Feet supported Sitting balance-Leahy  Scale: Fair     Standing balance support: Bilateral upper extremity supported;During functional activity Standing balance-Leahy Scale: Poor Standing balance comment: reliant on UE support                           ADL either performed or assessed with clinical judgement   ADL Overall ADL's : Needs assistance/impaired     Grooming: Wash/dry hands;Wash/dry face;Set up;Supervision/safety;Sitting   Upper Body Bathing: Set up;Supervision/ safety;Sitting Upper Body Bathing Details (indicate cue type and reason): simulated seated on BSC Lower Body Bathing: Maximal assistance Lower Body Bathing Details (indicate cue type and reason): simulated seated on BSC Upper Body Dressing : Set up;Supervision/safety;Sitting Upper Body Dressing Details (indicate cue type and reason): donned gown     Toilet Transfer: Minimal assistance;Min guard;Ambulation;RW;Cueing for safety;Cueing for sequencing;BSC   Toileting- Clothing Manipulation and Hygiene: Moderate assistance;Sit to/from stand       Functional mobility during ADLs: Minimal assistance;Min guard;Rolling walker;Cueing for safety;Cueing for sequencing General ADL Comments: Pt and her husband educated on ADL A/E for home use with video demo instruction     Vision Baseline Vision/History: Wears glasses Wears Glasses: Reading only Patient Visual Report: No change from baseline     Perception     Praxis      Cognition Arousal/Alertness: Awake/alert Behavior During Therapy: WFL for tasks assessed/performed Overall Cognitive Status: Impaired/Different from baseline Area of Impairment: Safety/judgement                         Safety/Judgement: Decreased awareness of safety  Exercises     Shoulder Instructions       General Comments Husband present. Patient provided extensive education on fall risk, safe mobility techniques, and need for assistance if returning home as well as potential need for  post acute rehab    Pertinent Vitals/ Pain       Pain Assessment: Faces Faces Pain Scale: Hurts even more Pain Location: R LE Pain Descriptors / Indicators: Aching;Sore;Operative site guarding Pain Intervention(s): Monitored during session;Repositioned  Home Living Family/patient expects to be discharged to:: Private residence Living Arrangements: Spouse/significant other                                      Prior Functioning/Environment              Frequency  Min 2X/week        Progress Toward Goals  OT Goals(current goals can now be found in the care plan section)  Progress towards OT goals: Progressing toward goals  Acute Rehab OT Goals Patient Stated Goal: to walk OT Goal Formulation: With patient/family  Plan Discharge plan remains appropriate    Co-evaluation      Reason for Co-Treatment: For patient/therapist safety;To address functional/ADL transfers PT goals addressed during session: Mobility/safety with mobility;Balance;Proper use of DME        AM-PAC OT "6 Clicks" Daily Activity     Outcome Measure   Help from another person eating meals?: None Help from another person taking care of personal grooming?: A Little Help from another person toileting, which includes using toliet, bedpan, or urinal?: A Lot Help from another person bathing (including washing, rinsing, drying)?: A Lot Help from another person to put on and taking off regular upper body clothing?: None Help from another person to put on and taking off regular lower body clothing?: A Lot 6 Click Score: 17    End of Session Equipment Utilized During Treatment: Gait belt;Rolling walker;Other (comment) (BSC)  OT Visit Diagnosis: Other abnormalities of gait and mobility (R26.89);Muscle weakness (generalized) (M62.81);History of falling (Z91.81);Pain Pain - Right/Left: Right Pain - part of body: Leg   Activity Tolerance Patient tolerated treatment well   Patient Left in  chair;with call bell/phone within reach;with family/visitor present   Nurse Communication          Time: 1017-5102 OT Time Calculation (min): 33 min  Charges: OT General Charges $OT Visit: 1 Visit OT Treatments $Self Care/Home Management : 8-22 mins     Britt Bottom 04/19/2020, 3:56 PM

## 2020-04-19 NOTE — Progress Notes (Signed)
Physical Therapy Treatment Patient Details Name: Laurie Atkins MRN: [id number] DOB: 17-May-1960 Today's Date: 04/19/2020    History of Present Illness Patient is a 60 y/o female with mechanical fall resulting in R femur fx. Patient s/p IM nail R femur on 1/18. PMH significant for aortic atherosclerosis.    PT Comments    Patient is self limiting with fear of pain and falling, however refuses to accept suggestions and directions on safe mobility techniques. Education provided of fall risk, safe mobility techniques, need for assistance, and potential need for post acute rehab if mobility does not progress. Patient requires min guard for transfers and 5' ambulation distance with RW, cues for hand placement and sequencing. Continue to recommend HHPT, however if patient's mobility does not progress and unable to negotiate stairs, will need SNF prior to returning home.   Follow Up Recommendations  Home health PT;Supervision for mobility/OOB (pending progress, if unable to progress ambulation/stair negotiation, will need SNF)     Equipment Recommendations  Rolling Brooklen Runquist with 5" wheels;3in1 (PT)    Recommendations for Other Services       Precautions / Restrictions Precautions Precautions: Fall Restrictions Weight Bearing Restrictions: Yes RLE Weight Bearing: Partial weight bearing RLE Partial Weight Bearing Percentage or Pounds: 50    Mobility  Bed Mobility               General bed mobility comments: sitting EOB on arrival with husband  Transfers Overall transfer level: Needs assistance Equipment used: Rolling Jaeleen Inzunza (2 wheeled) Transfers: Sit to/from [employer] Sit to Stand: Min guard Stand pivot transfers: Min guard       General transfer comment: required extensive encouragement and instruction as patient adamant about doing her way even if it's unsafe and puts her at risk for falls. Unwilling to listen to or follow directions. Cues for hand placement  and reaching BSC prior to sitting down with poor follow through. Educated patient on risk of falling and techniques to reduce the risk for falling  Ambulation/Gait Ambulation/Gait assistance: Min Paediatric nurse (Feet): 5 Feet Assistive device: Rolling Citlalli Weikel (2 wheeled) Gait Pattern/deviations: Step-to pattern;Decreased stride length;Antalgic;Decreased stance time - right;Trunk flexed Gait velocity: decreased   General Gait Details: trunk flexed throughout short distance with cues for upright posture   Stairs             Wheelchair Mobility    Modified Rankin (Stroke Patients Only)       Balance Overall balance assessment: Needs assistance Sitting-balance support: No upper extremity supported;Feet supported Sitting balance-Leahy Scale: Fair     Standing balance support: Bilateral upper extremity supported;During functional activity Standing balance-Leahy Scale: Poor Standing balance comment: reliant on UE support                            Cognition Arousal/Alertness: Awake/alert Behavior During Therapy: WFL for tasks assessed/performed Overall Cognitive Status: Impaired/Different from baseline Area of Impairment: Safety/judgement                         Safety/Judgement: Decreased awareness of safety            Exercises      General Comments General comments (skin integrity, edema, etc.): Husband present. Patient provided extensive education on fall risk, safe mobility techniques, and need for assistance if returning home as well as potential need for post acute rehab      Pertinent Vitals/Pain Pain Assessment:  Faces Faces Pain Scale: Hurts even more Pain Location: R LE Pain Descriptors / Indicators: Aching;Sore;Operative site guarding Pain Intervention(s): Monitored during session;Repositioned    Home Living Family/patient expects to be discharged to:: Private residence Living Arrangements: Spouse/significant other                   Prior Function            PT Goals (current goals can now be found in the care plan section) Acute Rehab PT Goals Patient Stated Goal: to walk PT Goal Formulation: With patient Time For Goal Achievement: 05/02/20 Potential to Achieve Goals: Good Progress towards PT goals: Progressing toward goals    Frequency    Min 5X/week      PT Plan Current plan remains appropriate    Co-evaluation PT/OT/SLP Co-Evaluation/Treatment: Yes Reason for Co-Treatment: To address functional/ADL transfers;Other (comment) (due to patient's tolerance) PT goals addressed during session: Mobility/safety with mobility;Balance;Proper use of DME        AM-PAC PT "6 Clicks" Mobility   Outcome Measure  Help needed turning from your back to your side while in a flat bed without using bedrails?: A Little Help needed moving from lying on your back to sitting on the side of a flat bed without using bedrails?: A Little Help needed moving to and from a bed to a chair (including a wheelchair)?: A Little Help needed standing up from a chair using your arms (e.g., wheelchair or bedside chair)?: A Little Help needed to walk in hospital room?: A Little Help needed climbing 3-5 steps with a railing? : A Lot 6 Click Score: 17    End of Session Equipment Utilized During Treatment: Gait belt Activity Tolerance: Patient limited by pain (Self limiting) Patient left: in chair;with call bell/phone within reach;with chair alarm set;with family/visitor present Nurse Communication: Mobility status PT Visit Diagnosis: Unsteadiness on feet (R26.81);Muscle weakness (generalized) (M62.81);History of falling (Z91.81);Difficulty in walking, not elsewhere classified (R26.2)     Time: 6503-5465 PT Time Calculation (min) (ACUTE ONLY): 30 min  Charges:  $Therapeutic Activity: 8-22 mins                     Denton Derks A. Dan Humphreys PT, DPT Acute Rehabilitation Services Pager [phone] Office  [phone]    Elissa Lovett 04/19/2020, 1:55 PM

## 2020-04-19 NOTE — Progress Notes (Signed)
Subjective: Laurie Atkins is a 60 year old female 2 days status post a right IM femur nail for a subtrochanteric fracture.  She fell while walking in the parking lot at work on 04/17/2020.  Today, she reports she is doing well with improvement in pain and feels like her swelling has improved. She denies numbness or tingling. She denies fever or chills. She reports she is going to work on sitting up at the bed side today.    She lives at home with her daughter and husband.  She reports that when she goes home should be alone during the day as her child would be back at school and her husband works during the day.  Objective:   VITALS:   Vitals:   04/18/20 1500 04/18/20 2002 04/19/20 0411 04/19/20 0833  BP: 129/77 129/74 140/75 131/76  Pulse: 90 91 91 97  Resp: 18 17 17 17   Temp: 98.3 F (36.8 C) 98.5 F (36.9 C) 97.9 F (36.6 C) 98.2 F (36.8 C)  TempSrc: Oral Oral  Oral  SpO2: 100% 96% 96% 95%  Weight:      Height:       Constitutional: General Appearance: healthy-appearing, well-nourished, and well-developed.Sitting up in hospital bed Level of Distress: no acute distress. Eyes: Lens (normal) clear: both eyes. Head: Head: normocephalic and atraumatic. Lungs: Respiratory effort: no dyspnea. Skin: Inspection and palpation: no rash, lesions Neurologic: Cranial Nerves: grossly intact. Sensation: grossly intact. Psychiatric: Insight: good judgement and insight. Mental Status: normal mood and affect and active and alert. Orientation: to time, place, and person.    Right Lower Extremity:  Neurologically intact Neurovascular intact Sensation intact distally Intact pulses distally Dorsiflexion/Plantar flexion intact Incision: dressing C/D/I Swelling of the proximal thigh, compartment soft Knee ROM 0-60 , limited by pain. DP2+   Lab Results  Component Value Date   WBC 9.9 04/18/2020   HGB 9.9 (L) 04/18/2020   HCT 29.4 (L) 04/18/2020   MCV 86.5 04/18/2020   PLT 203 04/18/2020    BMET    Component Value Date/Time   NA 135 04/18/2020 0537   NA 141 09/22/2018 1141   K 4.6 04/18/2020 0537   CL 102 04/18/2020 0537   CO2 22 04/18/2020 0537   GLUCOSE 145 (H) 04/18/2020 0537   BUN 16 04/18/2020 0537   BUN 12 09/22/2018 1141   CREATININE 0.89 04/18/2020 0537   CALCIUM 8.5 (L) 04/18/2020 0537   GFRNONAA >60 04/18/2020 0537   GFRAA 118 09/22/2018 1141     Assessment/Plan: 2 Days Post-Op   Active Problems:   Closed fracture of right femur (HCC)   Closed displaced subtrochanteric fracture of right femur (HCC)   Advance diet Up with therapy  Dressings intact.   Dressings can be changed as needed if saturated with Adaptic, 4 x 4's, and tape.  Recommend continuing to work with physical therapy.  Current PT recommendations are for home health therapy with supervision for mobility and getting out of bed.    She is doing well but recommend working with PT to sit up at bedside and transfer to bedside chair. Practice transferring to mimic bedside commode when she goes home would be helpful.   Recommend staying at least one more night, will re-evaluate tomorrow to see how she is doing.   Xrays reviewed with patients husband.   Pain medications as needed.  Advised to work on ankle and knee range of motion as tolerated.  Weightbearing Status: 50% weightbearing to the RLE DVT Prophylaxis: Lovenox inpatient, Aspirin  81mg  outpatient   04/19/2020, 9:49 AM  04/21/2020 PA-C  Physician Assistant with Dr. Dion Saucier [place]

## 2020-04-19 NOTE — Plan of Care (Signed)

## 2020-04-19 NOTE — Plan of Care (Signed)
  Problem: Activity: Goal: Risk for activity intolerance will decrease Outcome: Progressing   Problem: Pain Managment: Goal: General experience of comfort will improve Outcome: Progressing   Problem: Safety: Goal: Ability to remain free from injury will improve Outcome: Progressing   

## 2020-04-20 NOTE — Progress Notes (Signed)
Subjective: Laurie Atkins is a 60 year old female 3 days status post a right IM femur nail for a subtrochanteric fracture.  She fell while walking in the parking lot at work on 04/17/2020.  Today, she report she is doing well with mild pain worse with certain movements. She is currently in the chair working on knee ROM  and ankle ROM. She denies numbness and tingling, reports tightness associated with swelling. She feels that she would like to go home with HHPT but is fearful of falling and feels that in a few days to a week then she will be ready.   She lives at home with her daughter and husband.  She reports that when she goes home should be alone during the day as her child would be back at school and her husband works during the day.  Objective:   VITALS:   Vitals:   04/19/20 0833 04/19/20 1630 04/19/20 2011 04/20/20 0930  BP: 131/76 131/74 126/70 127/70  Pulse: 97 92 92 88  Resp: 17 16 16    Temp: 98.2 F (36.8 C) 98.7 F (37.1 C) 98.9 F (37.2 C) 98.5 F (36.9 C)  TempSrc: Oral Oral Oral Oral  SpO2: 95% 98% 99% 99%  Weight:      Height:       Constitutional: General Appearance: healthy-appearing, well-nourished, and well-developed.Sitting up in hospital chair.  Level of Distress: no acute distress. Eyes: Lens (normal) clear: both eyes. Head: Head: normocephalic and atraumatic. Lungs: Respiratory effort: no dyspnea. Skin: Inspection and palpation: no rash, lesions Neurologic: Cranial Nerves: grossly intact. Sensation: grossly intact. Psychiatric: Insight: good judgement and insight. Mental Status: normal mood and affect and active and alert. Orientation: to time, place, and person.    Right Lower Extremity:  Neurologically intact Neurovascular intact Sensation intact distally Intact pulses distally Dorsiflexion/Plantar flexion intact Incision: dressing C/D/I Swelling of the proximal thigh, compartment soft. Calf soft and nontender.  Knee ROM 0-110 , DP2+   Lab Results   Component Value Date   WBC 9.9 04/18/2020   HGB 9.9 (L) 04/18/2020   HCT 29.4 (L) 04/18/2020   MCV 86.5 04/18/2020   PLT 203 04/18/2020   BMET    Component Value Date/Time   NA 135 04/18/2020 0537   NA 141 09/22/2018 1141   K 4.6 04/18/2020 0537   CL 102 04/18/2020 0537   CO2 22 04/18/2020 0537   GLUCOSE 145 (H) 04/18/2020 0537   BUN 16 04/18/2020 0537   BUN 12 09/22/2018 1141   CREATININE 0.89 04/18/2020 0537   CALCIUM 8.5 (L) 04/18/2020 0537   GFRNONAA >60 04/18/2020 0537   GFRAA 118 09/22/2018 1141     Assessment/Plan: 3 Days Post-Op   Active Problems:   Closed fracture of right femur (HCC)   Closed displaced subtrochanteric fracture of right femur (HCC)   Advance diet Up with therapy  Dressings intact.   Dressings can be changed as needed if saturated with Adaptic, 4 x 4's, and tape.Can also place a mepilex at that site if she wants to shower.   Recommend continuing to work with physical therapy and bedside exercises.   Patients PT notes reviewed, noting patient having some constipation which is affecting her drive to work with therapy, note yesterday report self limiting due to fear of pain and difficulty accepting suggestions/directions for safe mobility. We will continue to follow PT recommendations for safe discharge to home with HHPT vs. SNF depending on progression in the hospital. This was discussed today with the patient  and encouraged to work hard with therapy.     Pain medications as needed.  Advised to work on ankle and knee range of motion as tolerated.  Weightbearing Status: 50% weightbearing to the RLE DVT Prophylaxis: Lovenox inpatient, Aspirin 81mg  outpatient   04/20/2020, 2:25 PM  04/22/2020 PA-C  Physician Assistant with Dr. Dion Saucier [place]

## 2020-04-20 NOTE — Progress Notes (Signed)
NCM received Workmen compensation information from CMA : [employer], Diane CM , # [phone]. Fax # [phone], [email or web address]. Clinicals fax per NCM. Please get in touch with Diane for TOC needs. Gae Gallop RN, BSN, CM

## 2020-04-20 NOTE — Progress Notes (Signed)
Occupational Therapy Treatment Patient Details Name: Laurie Atkins MRN: [id number] DOB: 06/15/1960 Today's Date: 04/20/2020    History of present illness Patient is a 60 y/o female with mechanical fall resulting in R femur fx. Patient s/p IM nail R femur on 1/18. PMH significant for aortic atherosclerosis.   OT comments  Pt in recliner upon arrival. Pt stood to RW min guard A with min verbal cues for correct hand placement. Pt ambulate d to bathroom with RW min guard A. Pt transferred to toilet min guard A with mi verbal cues for reaching back for armrests on 3 in 1. Pt manage clothing min A, peri hygiene min guard A standing. Pt stood at sink to wash and dey hands min guard A. Pt ambulated back to recliner and performed simulated bathing seated in recliner mod A. Pt mor receptive to cues and directions this session. OT will continue to follow acutely to maximize level of function and safety  Follow Up Recommendations  Home health OT    Equipment Recommendations  3 in 1 bedside commode;Other (comment) (ADL A/E kit, RW)    Recommendations for Other Services      Precautions / Restrictions Precautions Precautions: Fall Restrictions Weight Bearing Restrictions: Yes RLE Weight Bearing: Partial weight bearing RLE Partial Weight Bearing Percentage or Pounds: 50%       Mobility Bed Mobility               General bed mobility comments: pt in recliner upon arrival  Transfers Overall transfer level: Needs assistance Equipment used: Rolling walker (2 wheeled) Transfers: Sit to/from [facility] Sit to Stand: Min guard Stand pivot transfers: Min guard       General transfer comment: Close min guard for safety with transfers. VC for hand placement with RW. Pt requires increased time and effort for all mobility.    Balance Overall balance assessment: Needs assistance Sitting-balance support: No upper extremity supported;Feet supported Sitting balance-Leahy Scale:  Fair     Standing balance support: Bilateral upper extremity supported;During functional activity Standing balance-Leahy Scale: Poor                             ADL either performed or assessed with clinical judgement   ADL Overall ADL's : Needs assistance/impaired     Grooming: Wash/dry hands;Wash/dry face;Min guard;Standing;Cueing for safety       Lower Body Bathing: Moderate assistance;Sitting/lateral leans;Sit to/from stand;With caregiver independent assisting       Lower Body Dressing: Moderate assistance;Sitting/lateral leans;Sit to/from stand;With caregiver independent assisting   Toilet Transfer: Min guard;Ambulation;RW;Cueing for safety;Cueing for sequencing;Comfort height toilet;Grab bars   Toileting- Clothing Manipulation and Hygiene: Minimal assistance;Sit to/from stand       Functional mobility during ADLs: Min guard;Rolling walker;Cueing for safety;Cueing for sequencing       Vision Baseline Vision/History: Wears glasses Wears Glasses: Reading only Patient Visual Report: No change from baseline     Perception     Praxis      Cognition Arousal/Alertness: Awake/alert Behavior During Therapy: WFL for tasks assessed/performed Overall Cognitive Status: Within Functional Limits for tasks assessed                                          Exercises     Shoulder Instructions       General Comments  Pertinent Vitals/ Pain       Pain Assessment: 0-10 Pain Score: 4  Pain Location: R LE Pain Descriptors / Indicators: Aching;Sore;Operative site guarding Pain Intervention(s): Monitored during session;Repositioned;Premedicated before session  Home Living                                          Prior Functioning/Environment              Frequency  Min 2X/week        Progress Toward Goals  OT Goals(current goals can now be found in the care plan section)  Progress towards OT goals:  Progressing toward goals     Plan Discharge plan remains appropriate    Co-evaluation                 AM-PAC OT "6 Clicks" Daily Activity     Outcome Measure   Help from another person eating meals?: None Help from another person taking care of personal grooming?: A Little Help from another person toileting, which includes using toliet, bedpan, or urinal?: A Little Help from another person bathing (including washing, rinsing, drying)?: A Lot Help from another person to put on and taking off regular upper body clothing?: None Help from another person to put on and taking off regular lower body clothing?: A Lot 6 Click Score: 18    End of Session Equipment Utilized During Treatment: Gait belt;Rolling walker;Other (comment) (3 in 1 over toilet)  OT Visit Diagnosis: Other abnormalities of gait and mobility (R26.89);Muscle weakness (generalized) (M62.81);History of falling (Z91.81);Pain Pain - Right/Left: Right Pain - part of body: Leg   Activity Tolerance Patient tolerated treatment well   Patient Left in chair;with call bell/phone within reach   Nurse Communication          Time: 0300-9233 OT Time Calculation (min): 31 min  Charges: OT General Charges $OT Visit: 1 Visit OT Treatments $Self Care/Home Management : 8-22 mins $Therapeutic Activity: 8-22 mins     Britt Bottom 04/20/2020, 4:00 PM

## 2020-04-20 NOTE — Plan of Care (Signed)
  Problem: Health Behavior/Discharge Planning: Goal: Ability to manage health-related needs will improve Outcome: Progressing   Problem: Pain Managment: Goal: General experience of comfort will improve Outcome: Progressing   Problem: Safety: Goal: Ability to remain free from injury will improve Outcome: Progressing   

## 2020-04-20 NOTE — Progress Notes (Signed)
Physical Therapy Treatment Patient Details Name: Laurie Atkins MRN: [id number] DOB: 07-Feb-1961 Today's Date: 04/20/2020    History of Present Illness Patient is a 60 y/o female with mechanical fall resulting in R femur fx. Patient s/p IM nail R femur on 1/18. PMH significant for aortic atherosclerosis.    PT Comments    On arrival to room, pt initially declining secondary to constipation and nausea.  When educated on the benefits of therapy pt agreed to participate. Pt transferred to [facility] to attempt toileting prior to short distance ambulation and HEP training with handout given. Plan to progress gait next session and review standing HEP. Pt requires increased time and effort for all mobilities. Will need to evaluate next session HHPT vs SNF.    Follow Up Recommendations  Home health PT;Supervision for mobility/OOB (pending progress, if unable to progress ambulation/stair negotiation, will need SNF)     Equipment Recommendations  Rolling walker with 5" wheels;3in1 (PT)    Recommendations for Other Services OT consult     Precautions / Restrictions Precautions Precautions: Fall Restrictions Weight Bearing Restrictions: Yes RLE Weight Bearing: Partial weight bearing RLE Partial Weight Bearing Percentage or Pounds: 50    Mobility  Bed Mobility Overal bed mobility: Needs Assistance Bed Mobility: Supine to Sit;Sit to Supine     Supine to sit: Min assist     General bed mobility comments: min A for LE managment. Pt likes to move slowly and be in control, but does well when given time.  Transfers Overall transfer level: Needs assistance Equipment used: Rolling walker (2 wheeled) Transfers: Sit to/from [employer] Sit to Stand: Min guard Stand pivot transfers: Min guard       General transfer comment: Close min guard for safety with transfers. VC for hand placement with RW. Pt requires increased time and effort for all  mobility.  Ambulation/Gait Ambulation/Gait assistance: Min guard Gait Distance (Feet): 5 Feet Assistive device: Rolling walker (2 wheeled) Gait Pattern/deviations: Step-to pattern;Decreased stride length;Antalgic;Decreased stance time - right;Trunk flexed Gait velocity: decreased   General Gait Details: trunk flexed throughout short distance with cues for upright posture   Stairs             Wheelchair Mobility    Modified Rankin (Stroke Patients Only)       Balance Overall balance assessment: Needs assistance Sitting-balance support: No upper extremity supported;Feet supported Sitting balance-Leahy Scale: Fair     Standing balance support: Bilateral upper extremity supported;During functional activity Standing balance-Leahy Scale: Poor Standing balance comment: reliant on UE support                            Cognition Arousal/Alertness: Awake/alert Behavior During Therapy: WFL for tasks assessed/performed Overall Cognitive Status: Within Functional Limits for tasks assessed                                        Exercises Total Joint Exercises Ankle Circles/Pumps: AROM;Both;10 reps;Seated Quad Sets: AROM;Right;5 reps;Seated (5 sec holds) Short Arc Quad: AAROM;Right;10 reps;Seated Heel Slides: AAROM;Right;10 reps;Seated Hip ABduction/ADduction: AAROM;Right;10 reps;Seated    General Comments General comments (skin integrity, edema, etc.): Husband present and helpful during session.      Pertinent Vitals/Pain Pain Assessment: Faces Faces Pain Scale: Hurts even more Pain Location: R LE Pain Descriptors / Indicators: Aching;Sore;Operative site guarding Pain Intervention(s): Monitored during session;Limited activity within  patient's tolerance;Premedicated before session;RN gave pain meds during session;Patient requesting pain meds-RN notified;Ice applied    Home Living                      Prior Function             PT Goals (current goals can now be found in the care plan section) Acute Rehab PT Goals Patient Stated Goal: to walk PT Goal Formulation: With patient Time For Goal Achievement: 05/02/20 Potential to Achieve Goals: Good Progress towards PT goals: Progressing toward goals    Frequency    Min 5X/week      PT Plan Current plan remains appropriate    Co-evaluation              AM-PAC PT "6 Clicks" Mobility   Outcome Measure  Help needed turning from your back to your side while in a flat bed without using bedrails?: A Little Help needed moving from lying on your back to sitting on the side of a flat bed without using bedrails?: A Little Help needed moving to and from a bed to a chair (including a wheelchair)?: A Little Help needed standing up from a chair using your arms (e.g., wheelchair or bedside chair)?: A Little Help needed to walk in hospital room?: A Little Help needed climbing 3-5 steps with a railing? : A Lot 6 Click Score: 17    End of Session Equipment Utilized During Treatment: Gait belt Activity Tolerance: Patient tolerated treatment well;Patient limited by pain Patient left: in chair;with call bell/phone within reach;with family/visitor present Nurse Communication: Mobility status PT Visit Diagnosis: Unsteadiness on feet (R26.81);Muscle weakness (generalized) (M62.81);History of falling (Z91.81);Difficulty in walking, not elsewhere classified (R26.2)     Time: 2831-5176 PT Time Calculation (min) (ACUTE ONLY): 42 min  Charges:  $Gait Training: 8-22 mins $Therapeutic Exercise: 8-22 mins $Therapeutic Activity: 8-22 mins                    Kallie Locks, [place] Pager [phone] Acute Rehab   Sheral Apley 04/20/2020, 10:57 AM

## 2020-04-21 NOTE — Progress Notes (Signed)
Physical Therapy Treatment Patient Details Name: Laurie Atkins MRN: [id number] DOB: 1960-10-20 Today's Date: 04/21/2020    History of Present Illness Patient is a 60 y/o female with mechanical fall resulting in R femur fx. Patient s/p IM nail R femur on 1/18. PMH significant for aortic atherosclerosis.    PT Comments    Pt up in chair on arrival to room, agreeable to therapy session with encouragement, with fair participation and good tolerance for mobility. Pt able to progress to household gait distances (31ft using RW) and min guard assist for transfers/gait. Pt deferred stair training this date, reports she may have ramp installed, will continue to follow and may need stair training next session if plan to DC home (pt shower is on second floor of home). Pt continues to benefit from PT services to progress toward functional mobility goals. D/C recs below, pending progress.   Follow Up Recommendations  Home health PT;Supervision for mobility/OOB (pending progress vs ramp installation, has not yet completed stair trial, if unable and no ramp may need SNF)     Equipment Recommendations  Rolling walker with 5" wheels;3in1 (PT)    Recommendations for Other Services       Precautions / Restrictions Precautions Precautions: Fall Restrictions Weight Bearing Restrictions: Yes RLE Weight Bearing: Partial weight bearing RLE Partial Weight Bearing Percentage or Pounds: 50    Mobility  Bed Mobility                  Transfers Overall transfer level: Needs assistance Equipment used: Rolling walker (2 wheeled) Transfers: Sit to/from [employer] Sit to Stand: Min guard Stand pivot transfers: Min guard       General transfer comment: Close min guard for safety with transfers from chair and elevated toilet heights. VC for hand placement and foot placement with RW. Pt requires increased time and effort for all mobility.  Ambulation/Gait Ambulation/Gait assistance:  Min guard Gait Distance (Feet): 75 Feet (15ft to toilet, then 40ft) Assistive device: Rolling walker (2 wheeled) Gait Pattern/deviations: Step-to pattern;Decreased stride length;Antalgic;Decreased stance time - right;Trunk flexed Gait velocity: decreased   General Gait Details: min cues for posture and WB status, fair carryover, improved RW use and posture this date   Stairs             Wheelchair Mobility    Modified Rankin (Stroke Patients Only)       Balance Overall balance assessment: Needs assistance Sitting-balance support: No upper extremity supported;Feet supported Sitting balance-Leahy Scale: Fair     Standing balance support: Bilateral upper extremity supported;During functional activity Standing balance-Leahy Scale: Poor Standing balance comment: reliant on UE support                            Cognition Arousal/Alertness: Awake/alert Behavior During Therapy: WFL for tasks assessed/performed Overall Cognitive Status: Within Functional Limits for tasks assessed                                 General Comments: increased time for initiating/processing cues at times but fair carryover of safety cues with transfers; chair alarm on for safety      Exercises Total Joint Exercises Ankle Circles/Pumps: AROM;Both;10 reps;Seated Other Exercises Other Exercises: pt already with HEP handout in room from previous session, verbally reviewed and encouraged compliance TID    General Comments General comments (skin integrity, edema, etc.): no acute s/sx  distress with transfers and VSS per chart review, not otherwise assesed      Pertinent Vitals/Pain Pain Assessment: 0-10 Pain Score: 5  Pain Location: R LE Pain Descriptors / Indicators: Aching;Sore;Operative site guarding Pain Intervention(s): Monitored during session;Repositioned;Ice applied    Home Living                      Prior Function            PT Goals (current  goals can now be found in the care plan section) Acute Rehab PT Goals Patient Stated Goal: to walk PT Goal Formulation: With patient Time For Goal Achievement: 05/02/20 Potential to Achieve Goals: Good Progress towards PT goals: Progressing toward goals    Frequency    Min 5X/week      PT Plan Current plan remains appropriate    Co-evaluation              AM-PAC PT "6 Clicks" Mobility   Outcome Measure  Help needed turning from your back to your side while in a flat bed without using bedrails?: A Little Help needed moving from lying on your back to sitting on the side of a flat bed without using bedrails?: A Little Help needed moving to and from a bed to a chair (including a wheelchair)?: A Little Help needed standing up from a chair using your arms (e.g., wheelchair or bedside chair)?: A Little Help needed to walk in hospital room?: A Little Help needed climbing 3-5 steps with a railing? : A Lot 6 Click Score: 17    End of Session Equipment Utilized During Treatment: Gait belt Activity Tolerance: Patient tolerated treatment well;Patient limited by pain Patient left: in chair;with call bell/phone within reach;with chair alarm set Nurse Communication: Mobility status PT Visit Diagnosis: Unsteadiness on feet (R26.81);Muscle weakness (generalized) (M62.81);History of falling (Z91.81);Difficulty in walking, not elsewhere classified (R26.2)     Time: 1346-1410 PT Time Calculation (min) (ACUTE ONLY): 24 min  Charges:  $Gait Training: 8-22 mins $Therapeutic Activity: 8-22 mins                     Edy Belt P., PTA Acute Rehabilitation Services Pager: [phone] Office: [phone]   Angus Palms 04/21/2020, 2:21 PM

## 2020-04-21 NOTE — Progress Notes (Signed)
    Subjective: 4 Days Post-Op Procedure(s) (LRB): INTRAMEDULLARY (IM) NAIL FEMORAL (Right) Patient reports pain as 3 on 0-10 scale.   Denies CP or SOB.  Voiding without difficulty. Positive flatus. Objective: Vital signs in last 24 hours: Temp:  [97.8 F (36.6 C)-98.7 F (37.1 C)] 97.9 F (36.6 C) (01/22 0833) Pulse Rate:  [77-98] 98 (01/22 0833) Resp:  [15-17] 17 (01/22 0833) BP: (116-136)/(63-86) 116/63 (01/22 0833) SpO2:  [94 %-100 %] 94 % (01/22 0833)  Intake/Output from previous day: 01/21 0701 - 01/22 0700 In: 240 [P.O.:240] Out: -  Intake/Output this shift: No intake/output data recorded.  Labs: No results for input(s): HGB in the last 72 hours. No results for input(s): WBC, RBC, HCT, PLT in the last 72 hours. No results for input(s): NA, K, CL, CO2, BUN, CREATININE, GLUCOSE, CALCIUM in the last 72 hours. No results for input(s): LABPT, INR in the last 72 hours.  Physical Exam: Neurologically intact ABD soft Intact pulses distally Incision: dressing C/D/I Compartment soft Body mass index is 23.52 kg/m.   Assessment/Plan: 4 Days Post-Op Procedure(s) (LRB): INTRAMEDULLARY (IM) NAIL FEMORAL (Right) Advance diet Up with therapy Discharge to SNF vs home.  Will work with PT today - plan on d/c tomorrow  Alvy Beal for Dr. Venita Lick [facility] [phone] 04/21/2020, 8:50 AM

## 2020-04-21 NOTE — Plan of Care (Signed)

## 2020-04-22 NOTE — Plan of Care (Signed)

## 2020-04-22 NOTE — Progress Notes (Signed)
Physical Therapy Treatment Patient Details Name: Hopie Pellegrin MRN: [id number] DOB: Sep 14, 1960 Today's Date: 04/22/2020    History of Present Illness Patient is a 60 y/o female with mechanical fall resulting in R femur fx. Patient s/p IM nail R femur on 1/18. PMH significant for aortic atherosclerosis.    PT Comments    Pt making progress ambulating an increased distance of ~125 ft with a RW and min guard this date, allowing for mobility within her home. Entrance to her home remains to be an obstacle as she required modA and bil UE support on L rail to ascend and descend 1 stair this date. Pt would benefit from ramp installation to permit increased independence with getting into/out of her home. Will continue to follow acutely. Current recommendations remain appropriate.    Follow Up Recommendations  Home health PT;Supervision for mobility/OOB (pending progress vs ramp installation, has not yet completed stair trial adequate for home entry, if unable to enter home safely and no ramp may need SNF)     Equipment Recommendations  Rolling walker with 5" wheels;3in1 (PT)    Recommendations for Other Services       Precautions / Restrictions Precautions Precautions: Fall Restrictions Weight Bearing Restrictions: Yes RLE Weight Bearing: Partial weight bearing RLE Partial Weight Bearing Percentage or Pounds: 50 Other Position/Activity Restrictions: patient tends to keep R LE internally rotated, encouraged neutral positioning in bed    Mobility  Bed Mobility               General bed mobility comments: Pt sitting up in recliner upon arrival.  Transfers Overall transfer level: Needs assistance Equipment used: Rolling walker (2 wheeled) Transfers: Sit to/from Stand Sit to Stand: Min guard         General transfer comment: Close min guard for safety with transfers from chair. VC for hand placement and foot placement with RW, pt initiating placing R leg anteriorly with coming  to stand and returning to sit without cues. Pt requires increased time and effort for all mobility.  Ambulation/Gait Ambulation/Gait assistance: Min guard Gait Distance (Feet): 125 Feet Assistive device: Rolling walker (2 wheeled) Gait Pattern/deviations: Decreased stride length;Antalgic;Decreased stance time - right;Trunk flexed;Decreased step length - right;Decreased step length - left;Step-through pattern Gait velocity: decreased Gait velocity interpretation: <1.31 ft/sec, indicative of household ambulator General Gait Details: min cues for maintaining WB status, with pt displaying decreased bil step length. Cued pt to increase symmetrically, min success. Heavy reliance on UEs to unweight R leg appropriately. Cued pt to inc speed, mod success.   Stairs Stairs: Yes Stairs assistance: Mod assist Stair Management: One rail Left;Step to pattern;Forwards;Backwards Number of Stairs: 1 General stair comments: Ascending 1 stair forward and then descending backward with bil UEs on L rail to pull up to unweight R leg, modA for stability and to unweight leg. Educated pt on leading up with L and down with R, success.   Wheelchair Mobility    Modified Rankin (Stroke Patients Only)       Balance Overall balance assessment: Needs assistance Sitting-balance support: No upper extremity supported;Feet supported Sitting balance-Leahy Scale: Fair     Standing balance support: Bilateral upper extremity supported;During functional activity Standing balance-Leahy Scale: Poor Standing balance comment: reliant on UE support                            Cognition Arousal/Alertness: Awake/alert Behavior During Therapy: WFL for tasks assessed/performed Overall Cognitive Status: Within Functional  Limits for tasks assessed                                 General Comments: increased time for initiating/processing cues at times but fair carryover of safety cues with transfers;  chair alarm on for safety; pt easily distracted and conversates throughout      Exercises      General Comments        Pertinent Vitals/Pain Pain Assessment: Faces Faces Pain Scale: Hurts little more Pain Location: R LE Pain Descriptors / Indicators: Aching;Sore;Operative site guarding Pain Intervention(s): Limited activity within patient's tolerance;Monitored during session;Repositioned;Patient requesting pain meds-RN notified    Home Living                      Prior Function            PT Goals (current goals can now be found in the care plan section) Acute Rehab PT Goals Patient Stated Goal: to walk PT Goal Formulation: With patient Time For Goal Achievement: 05/02/20 Potential to Achieve Goals: Good Progress towards PT goals: Progressing toward goals    Frequency    Min 5X/week      PT Plan Current plan remains appropriate    Co-evaluation              AM-PAC PT "6 Clicks" Mobility   Outcome Measure  Help needed turning from your back to your side while in a flat bed without using bedrails?: A Little Help needed moving from lying on your back to sitting on the side of a flat bed without using bedrails?: A Little Help needed moving to and from a bed to a chair (including a wheelchair)?: A Little Help needed standing up from a chair using your arms (e.g., wheelchair or bedside chair)?: A Little Help needed to walk in hospital room?: A Little Help needed climbing 3-5 steps with a railing? : A Lot 6 Click Score: 17    End of Session Equipment Utilized During Treatment: Gait belt Activity Tolerance: Patient tolerated treatment well;Patient limited by pain Patient left: in chair;with call bell/phone within reach;with chair alarm set   PT Visit Diagnosis: Unsteadiness on feet (R26.81);Muscle weakness (generalized) (M62.81);History of falling (Z91.81);Difficulty in walking, not elsewhere classified (R26.2);Other abnormalities of gait and mobility  (R26.89);Pain Pain - Right/Left: Right Pain - part of body: Leg     Time: 2119-4174 PT Time Calculation (min) (ACUTE ONLY): 40 min  Charges:  $Gait Training: 38-52 mins                     Raymond Gurney, PT, DPT Acute Rehabilitation Services  Pager: [phone] Office: [phone]    Jewel Baize 04/22/2020, 3:44 PM

## 2020-04-22 NOTE — Plan of Care (Signed)

## 2020-04-22 NOTE — Progress Notes (Signed)
   Subjective: 5 Days Post-Op Procedure(s) (LRB): INTRAMEDULLARY (IM) NAIL FEMORAL (Right) Patient reports pain as mild.   Patient seen in rounds for Dr. Aundria Rud. Patient is well, and has had no acute complaints or problems other than pain in the right thigh. States she is a little frustrated with her slower progression with therapy. Discussed with patient that it can take several weeks before she is ambulating more comfortably. Denies chest pain or SOB. No issues overnight. Voiding without difficulty.  Objective: Vital signs in last 24 hours: Temp:  [97.9 F (36.6 C)-99.2 F (37.3 C)] 99.2 F (37.3 C) (01/23 0811) Pulse Rate:  [83-88] 83 (01/23 0811) Resp:  [16-17] 16 (01/23 0811) BP: (112-124)/(62-69) 121/64 (01/23 0811) SpO2:  [95 %-98 %] 97 % (01/23 0811)  Intake/Output from previous day:  Intake/Output Summary (Last 24 hours) at 04/22/2020 0904 Last data filed at 04/21/2020 2300 Gross per 24 hour  Intake 120 ml  Output --  Net 120 ml    Exam: General - Patient is Alert and Oriented Extremity - Neurologically intact Neurovascular intact Sensation intact distally Dorsiflexion/Plantar flexion intact Dressing/Incision - clean, dry, no drainage Motor Function - intact, moving foot and toes well on exam.   History reviewed. No pertinent past medical history.  Assessment/Plan: 5 Days Post-Op Procedure(s) (LRB): INTRAMEDULLARY (IM) NAIL FEMORAL (Right) Active Problems:   Closed fracture of right femur (HCC)   Closed displaced subtrochanteric fracture of right femur (HCC)  Estimated body mass index is 23.52 kg/m as calculated from the following:   Height as of this encounter: 5\' 4"  (1.626 m).   Weight as of this encounter: 62.1 kg. Up with therapy  DVT Prophylaxis - Lovenox  PWB to the RLE (50%)  Dispo HHPT versus SNF. Patient states today that workers comp is getting a ramp set up at her home for accessibility. Physical therapy recommending HHPT currently. Discussed  with patient that it is highly unlikely that the ramp will get set up today, will more likely discharge tomorrow.   , PA-C Orthopedic Surgery [phone] 04/22/2020, 9:04 AM

## 2020-04-22 NOTE — Plan of Care (Signed)

## 2020-04-23 NOTE — Plan of Care (Signed)

## 2020-04-23 NOTE — Progress Notes (Signed)
Physical Therapy Treatment Patient Details Name: Laurie Atkins MRN: [id number] DOB: 1960-12-05 Today's Date: 04/23/2020    History of Present Illness Pt is a 60 y/o female admitted 04/17/20 after fall sustaining R femur fx. S/p IM nail R femur on 1/18. PMH significant for aortic atherosclerosis.   PT Comments    Pt progressing well with mobility. Today's session focused on gait training with RW; pt's gait mechanics and stability improving with intermittent cues. Pt declining additional stair training - reports that if ramp not installed by d/c, pt's husband and son plan to lift her into home in chair. Continue to recommend follow-up with HHPT services to maximize functional mobility and independence. If pt to remain admitted, will continue to follow acutely.   Follow Up Recommendations  Home health PT;Supervision for mobility/OOB     Equipment Recommendations  Rolling walker with 5" wheels;3in1 (PT)    Recommendations for Other Services       Precautions / Restrictions Precautions Precautions: Fall Restrictions Weight Bearing Restrictions: Yes RLE Weight Bearing: Partial weight bearing RLE Partial Weight Bearing Percentage or Pounds: 50    Mobility  Bed Mobility Overal bed mobility: Modified Independent Bed Mobility: Supine to Sit           General bed mobility comments: Use of LLE to assist RLE to EOB; HOB elevated  Transfers Overall transfer level: Needs assistance Equipment used: Rolling walker (2 wheeled) Transfers: Sit to/from Stand Sit to Stand: Supervision         General transfer comment: Pt with good carryover of sequencing, technique and hand placement standing with RW; supervision for safety  Ambulation/Gait Ambulation/Gait assistance: Min guard;Supervision Gait Distance (Feet): 180 Feet Assistive device: Rolling walker (2 wheeled) Gait Pattern/deviations: Step-through pattern;Decreased stride length;Decreased weight shift to right;Antalgic Gait  velocity: Decreased   General Gait Details: Pt able to recall correct WB precautions; pt with heavy reliance on BUE with antalgic gait pattern; cues to increase step length and maintain correct proximity to RW (pt tends to stay too close to front of RW); somewhat able to improve fluidity of gait; speed improved   Stairs Stairs:  (pt adamantly declining additional stair training, "I'm not going to do something my leg cannot do yet." Reports her husband and son plan to carry her up stairs into home while she is "strapped into a chair" - pt unsure if ramp will be installed in time)           Wheelchair Mobility    Modified Rankin (Stroke Patients Only)       Balance Overall balance assessment: Needs assistance Sitting-balance support: No upper extremity supported;Feet supported Sitting balance-Leahy Scale: Good Sitting balance - Comments: Able to don L sock, requires assist for reaching R foot   Standing balance support: Bilateral upper extremity supported;During functional activity Standing balance-Leahy Scale: Fair Standing balance comment: Can static stand without UE support                            Cognition Arousal/Alertness: Awake/alert Behavior During Therapy: WFL for tasks assessed/performed Overall Cognitive Status: Within Functional Limits for tasks assessed                                 General Comments: WFL for simple tasks; pt very conversant, sometimes requiring redirection to task      Exercises Other Exercises Other Exercises: Replaced standard post-op hip  HEP with medbridge HEP handout - heel slides w/ strap, quad sets, supine hip abd, LAQ    General Comments        Pertinent Vitals/Pain Pain Assessment: Faces Faces Pain Scale: Hurts a little bit Pain Location: R LE Pain Descriptors / Indicators: Discomfort;Guarding Pain Intervention(s): Monitored during session    Home Living                      Prior  Function            PT Goals (current goals can now be found in the care plan section) Progress towards PT goals: Progressing toward goals    Frequency    Min 5X/week      PT Plan Current plan remains appropriate    Co-evaluation PT/OT/SLP Co-Evaluation/Treatment:  (dovetail)            AM-PAC PT "6 Clicks" Mobility   Outcome Measure  Help needed turning from your back to your side while in a flat bed without using bedrails?: None Help needed moving from lying on your back to sitting on the side of a flat bed without using bedrails?: None Help needed moving to and from a bed to a chair (including a wheelchair)?: A Little Help needed standing up from a chair using your arms (e.g., wheelchair or bedside chair)?: A Little Help needed to walk in hospital room?: A Little Help needed climbing 3-5 steps with a railing? : A Lot 6 Click Score: 19    End of Session Equipment Utilized During Treatment: Gait belt Activity Tolerance: Patient tolerated treatment well Patient left:  (in hallway with OT) Nurse Communication: Mobility status PT Visit Diagnosis: Muscle weakness (generalized) (M62.81);History of falling (Z91.81);Difficulty in walking, not elsewhere classified (R26.2);Other abnormalities of gait and mobility (R26.89);Pain Pain - Right/Left: Right Pain - part of body: Leg     Time: 1030-1050 PT Time Calculation (min) (ACUTE ONLY): 20 min  Charges:  $Gait Training: 8-22 mins                     Ina Homes, PT, DPT Acute Rehabilitation Services  Pager [phone] Office [phone]  Malachy Chamber 04/23/2020, 12:29 PM

## 2020-04-23 NOTE — Progress Notes (Signed)
Subjective: Laurie Atkins is a 60 year old female 6 days status post a right IM femur nail for a subtrochanteric fracture.  She fell while walking in the parking lot at work on 04/17/2020.  Today, she reports she is doing okay with mild pain. She has worked with therapy , reports working with stairs did not go very well limited by leg motion and pain. She reports workers comp told her they could get a ramp but then asked for an order. She reports there is not a ramp installed and does have a few stairs to get into her home. She denies    She lives at home with her daughter and husband.  She reports that when she goes home should be alone during the day as her child would be back at school and her husband works during the day.  Objective:   VITALS:   Vitals:   04/22/20 1940 04/22/20 2048 04/23/20 0341 04/23/20 0844  BP: (!) 109/51 117/68 (!) 118/53 117/63  Pulse: 97 87 81 81  Resp: 17 18 16 17   Temp: 98.3 F (36.8 C) 98.7 F (37.1 C) 99 F (37.2 C) 98.9 F (37.2 C)  TempSrc: Oral Oral Oral Oral  SpO2: 98% 100% 92% 95%  Weight:      Height:       Constitutional: General Appearance: healthy-appearing, well-nourished, and well-developed.Sitting up in hospital chair.  Level of Distress: no acute distress. Eyes: Lens (normal) clear: both eyes. Head: Head: normocephalic and atraumatic. Lungs: Respiratory effort: no dyspnea. Skin: Inspection and palpation: no rash, lesions Neurologic: Cranial Nerves: grossly intact. Sensation: grossly intact. Psychiatric: Insight: good judgement and insight. Mental Status: normal mood and affect and active and alert. Orientation: to time, place, and person.    Right Lower Extremity:  Neurologically intact Neurovascular intact Sensation intact distally Intact pulses distally Dorsiflexion/Plantar flexion intact Incision: dressing C/D/I  Dressing with small amount of bloody drainage, middle bandage replaced with mepilex today.  Swelling of the proximal  thigh, compartment soft. Calf soft and nontender.  Knee ROM 0-95 today , DP2+   Lab Results  Component Value Date   WBC 9.9 04/18/2020   HGB 9.9 (L) 04/18/2020   HCT 29.4 (L) 04/18/2020   MCV 86.5 04/18/2020   PLT 203 04/18/2020   BMET    Component Value Date/Time   NA 135 04/18/2020 0537   NA 141 09/22/2018 1141   K 4.6 04/18/2020 0537   CL 102 04/18/2020 0537   CO2 22 04/18/2020 0537   GLUCOSE 145 (H) 04/18/2020 0537   BUN 16 04/18/2020 0537   BUN 12 09/22/2018 1141   CREATININE 0.89 04/18/2020 0537   CALCIUM 8.5 (L) 04/18/2020 0537   GFRNONAA >60 04/18/2020 0537   GFRAA 118 09/22/2018 1141     Assessment/Plan: 6 Days Post-Op   Active Problems:   Closed fracture of right femur (HCC)   Closed displaced subtrochanteric fracture of right femur (HCC)   Advance diet Up with therapy  Dressings intact.   Dressings can be changed as needed with mepilex, middle  dressing changed today.  Recommend continuing to work with physical therapy and bedside exercises.   Today, PT note reviewed continuing to recommend HHPT, will discuss discharge with Dr. 09/24/2018, possibly tomorrow or next day depending on re-assessment. Patient will likely not have ramp at home at time of discharge.  We also discussed orders for home equipment, will discuss with Dr. Aundria Rud including ramp, shower chair, bedside commode, wheelchair.     Pain medications  as needed.  Advised to work on ankle and knee range of motion as tolerated.  Weightbearing Status: 50% weightbearing to the RLE DVT Prophylaxis: Lovenox inpatient, Aspirin 81mg  outpatient   04/23/2020, 12:26 PM  04/25/2020 PA-C  Physician Assistant with Dr. Dion Saucier [place]

## 2020-04-23 NOTE — Progress Notes (Signed)
Occupational Therapy Treatment Patient Details Name: Laurie Atkins MRN: [id number] DOB: 17-Oct-1960 Today's Date: 04/23/2020    History of present illness Pt is a 60 y/o female admitted 04/17/20 after fall sustaining R femur fx. S/p IM nail R femur on 1/18. PMH significant for aortic atherosclerosis.   OT comments  Pt continues to make good progress with functional goals. OT will continue to follow acutely to maximize level off function and safety  Follow Up Recommendations  Home health OT    Equipment Recommendations  3 in 1 bedside commode;Other (comment) (RW, ADL A/E kit)    Recommendations for Other Services      Precautions / Restrictions Precautions Precautions: Fall Restrictions Weight Bearing Restrictions: Yes RLE Weight Bearing: Partial weight bearing RLE Partial Weight Bearing Percentage or Pounds: 50%       Mobility Bed Mobility Overal bed mobility: Modified Independent Bed Mobility: Supine to Sit           General bed mobility comments: pt ambulating with PT in hallway  Transfers Overall transfer level: Needs assistance Equipment used: Rolling walker (2 wheeled) Transfers: Sit to/from Stand Sit to Stand: Supervision         General transfer comment: Pt with good carryover of sequencing, technique and hand placement standing with RW; supervision for safety    Balance Overall balance assessment: Needs assistance Sitting-balance support: No upper extremity supported;Feet supported Sitting balance-Leahy Scale: Good Sitting balance - Comments: Able to don L sock, requires assist for reaching R foot   Standing balance support: Bilateral upper extremity supported;During functional activity Standing balance-Leahy Scale: Fair Standing balance comment: Can static stand without UE support                           ADL either performed or assessed with clinical judgement   ADL Overall ADL's : Needs assistance/impaired Eating/Feeding:  Independent;Sitting   Grooming: Wash/dry hands;Wash/dry face;Standing;Set up;Supervision/safety   Upper Body Bathing: Set up;Sitting Upper Body Bathing Details (indicate cue type and reason): simulated seated Lower Body Bathing: Moderate assistance;Sitting/lateral leans;Sit to/from stand;With caregiver independent assisting Lower Body Bathing Details (indicate cue type and reason): simulated seated Upper Body Dressing : Set up;Sitting   Lower Body Dressing: Moderate assistance;Sitting/lateral leans;Sit to/from stand;With caregiver independent assisting   Toilet Transfer: Ambulation;RW;Cueing for safety;Cueing for sequencing;Comfort height toilet;Grab bars;Supervision/safety   Toileting- Clothing Manipulation and Hygiene: Min guard;Sit to/from stand       Functional mobility during ADLs: Rolling walker;Cueing for safety;Cueing for sequencing;Supervision/safety       Vision Baseline Vision/History: Wears glasses Wears Glasses: Reading only Patient Visual Report: No change from baseline     Perception     Praxis      Cognition Arousal/Alertness: Awake/alert Behavior During Therapy: WFL for tasks assessed/performed Overall Cognitive Status: Within Functional Limits for tasks assessed                                 General Comments: WFL for simple tasks; pt very conversant, sometimes requiring redirection to task        Exercises Other Exercises Other Exercises: Replaced standard post-op hip HEP with medbridge HEP handout - heel slides w/ strap, quad sets, supine hip abd, LAQ   Shoulder Instructions       General Comments      Pertinent Vitals/ Pain       Pain Assessment: 0-10 Pain Score: 3  Faces  Pain Scale: Hurts a little bit Pain Location: R LE Pain Descriptors / Indicators: Discomfort;Guarding Pain Intervention(s): Monitored during session;Repositioned  Home Living                                          Prior  Functioning/Environment              Frequency  Min 2X/week        Progress Toward Goals  OT Goals(current goals can now be found in the care plan section)  Progress towards OT goals: Progressing toward goals     Plan Discharge plan remains appropriate    Co-evaluation                 AM-PAC OT "6 Clicks" Daily Activity     Outcome Measure   Help from another person eating meals?: None Help from another person taking care of personal grooming?: A Little Help from another person toileting, which includes using toliet, bedpan, or urinal?: A Little Help from another person bathing (including washing, rinsing, drying)?: A Lot Help from another person to put on and taking off regular upper body clothing?: None Help from another person to put on and taking off regular lower body clothing?: A Lot 6 Click Score: 18    End of Session Equipment Utilized During Treatment: Gait belt;Rolling walker;Other (comment) (3 in 1 over toilet)  OT Visit Diagnosis: Other abnormalities of gait and mobility (R26.89);Muscle weakness (generalized) (M62.81);History of falling (Z91.81);Pain Pain - Right/Left: Right Pain - part of body: Leg   Activity Tolerance Patient tolerated treatment well   Patient Left in chair;with call bell/phone within reach   Nurse Communication          Time: 7847-8412 OT Time Calculation (min): 17 min  Charges: OT General Charges $OT Visit: 1 Visit OT Treatments $Self Care/Home Management : 8-22 mins     Britt Bottom 04/23/2020, 12:53 PM

## 2020-04-24 LAB — CREATININE, SERUM
Creatinine, Ser: 0.61 mg/dL (ref 0.44–1.00)
GFR, Estimated: >60 mL/min (ref >60)

## 2020-04-24 NOTE — Plan of Care (Signed)
  Problem: Pain Managment: Goal: General experience of comfort will improve Outcome: Progressing   Problem: Safety: Goal: Ability to remain free from injury will improve Outcome: Progressing   

## 2020-04-24 NOTE — Progress Notes (Signed)
Subjective: Laurie Atkins is a 60 year old female 7 days status post a right IM femur nail for a subtrochanteric fracture.  She fell while walking in the parking lot at work on 04/17/2020.  Today,she is doing well with mild pain. Reports WC is in contact regarding DME. Reports muscle relaxer causes twinges, does not want this at discharge.   She lives at home with her daughter and husband.  She reports that when she goes home should be alone during the day as her child would be back at school and her husband works during the day.  Objective:   VITALS:   Vitals:   04/23/20 0844 04/23/20 1500 04/23/20 2042 04/24/20 1422  BP: 117/63 117/68 (!) 120/56 115/60  Pulse: 81 87 88 81  Resp: 17 17 16 18   Temp: 98.9 F (37.2 C) 98.8 F (37.1 C) 98.7 F (37.1 C) 99.3 F (37.4 C)  TempSrc: Oral Oral Oral Oral  SpO2: 95% 98% 98% 100%  Weight:      Height:       Constitutional: General Appearance: healthy-appearing, well-nourished, and well-developed.Sitting up in hospital chair.  Level of Distress: no acute distress. Eyes: Lens (normal) clear: both eyes. Head: Head: normocephalic and atraumatic. Lungs: Respiratory effort: no dyspnea. Skin: Inspection and palpation: no rash, lesions Neurologic: Cranial Nerves: grossly intact. Sensation: grossly intact. Psychiatric: Insight: good judgement and insight. Mental Status: normal mood and affect and active and alert. Orientation: to time, place, and person.    Right Lower Extremity:  Neurologically intact Neurovascular intact Sensation intact distally Intact pulses distally Dorsiflexion/Plantar flexion intact Incision: dressing C/D/I  Dressings intact without drainage.  Improved Swelling of the proximal thigh, compartment soft. Calf soft and nontender.  Knee ROM 0-95 today , DP2+   Lab Results  Component Value Date   WBC 9.9 04/18/2020   HGB 9.9 (L) 04/18/2020   HCT 29.4 (L) 04/18/2020   MCV 86.5 04/18/2020   PLT 203 04/18/2020   BMET     Component Value Date/Time   NA 135 04/18/2020 0537   NA 141 09/22/2018 1141   K 4.6 04/18/2020 0537   CL 102 04/18/2020 0537   CO2 22 04/18/2020 0537   GLUCOSE 145 (H) 04/18/2020 0537   BUN 16 04/18/2020 0537   BUN 12 09/22/2018 1141   CREATININE 0.61 04/24/2020 0514   CALCIUM 8.5 (L) 04/18/2020 0537   GFRNONAA >60 04/24/2020 0514   GFRAA 118 09/22/2018 1141     Assessment/Plan: 7 Days Post-Op   Active Problems:   Closed fracture of right femur (HCC)   Closed displaced subtrochanteric fracture of right femur (HCC)   Advance diet Up with therapy  Dressings intact.   Recommend continuing to work with physical therapy and bedside exercises.   Pt notes reviewed, progressing well with therapy. Discharge likely tomorrow or next day pending DME via workers compensation who is in contact with the patient and supposed to call tonight. Per WC recommended their own DME for discharge however if patient is ready for discharge then hospital provided DME may be needed.   DME orders placed for discharge including ramp, 3 in 1, wheelchair, and walker.HHPT/HHOT order also placed   Superficial thrombophlebitis noted on the left antecubital fossa, small area of purulent material noted at the IV site. Mild erythema, no streaking. Recommend cold compresses, watching for increased purulence or spreading redness. If worsening may recommend antibiotics but at this time this should improve with removal of IV and time.   Pain medications as needed.  Advised to work on ankle and knee range of motion as tolerated.  Weightbearing Status: 50% weightbearing to the RLE DVT Prophylaxis: Lovenox inpatient, Aspirin 81mg  outpatient  Return to [facility] 7-10 days s/p discharge.   04/24/2020, 6:35 PM  04/26/2020 PA-C  Physician Assistant with Dr. Dion Saucier [place]

## 2020-04-24 NOTE — Progress Notes (Signed)
IV site redness, and pain. Removed IV.

## 2020-04-24 NOTE — Progress Notes (Signed)
Physical Therapy Treatment Patient Details Name: Laurie Atkins MRN: [id number] DOB: 1961-02-24 Today's Date: 04/24/2020    History of Present Illness Pt is a 60 y/o female admitted 04/17/20 after fall sustaining R femur fx. S/p IM nail R femur on 1/18. PMH significant for aortic atherosclerosis.   PT Comments    Pt progressing well with mobility. Session focused on gait training with RW and LE therex; pt moving well with RW. Pt has no further questions or concerns. From a mobility perspective, safe to d/c home once she has recommended DME. If to remain admitted, will continue to follow acutely to maximize functional mobility and independence.    Follow Up Recommendations  Home health PT;Supervision - Intermittent     Equipment Recommendations  Rolling walker with 5" wheels;3in1 (PT)    Recommendations for Other Services       Precautions / Restrictions Precautions Precautions: Fall Restrictions Weight Bearing Restrictions: Yes RLE Weight Bearing: Partial weight bearing RLE Partial Weight Bearing Percentage or Pounds: 50    Mobility  Bed Mobility Overal bed mobility: Modified Independent Bed Mobility: Supine to Sit           General bed mobility comments: Mod indep with use of LLE to assist RLE out of bed; HOB elevated  Transfers Overall transfer level: Modified independent Equipment used: Rolling walker (2 wheeled) Transfers: Sit to/from Stand              Ambulation/Gait Ambulation/Gait assistance: Supervision Gait Distance (Feet): 300 Feet Assistive device: Rolling walker (2 wheeled) Gait Pattern/deviations: Step-through pattern;Decreased stride length;Decreased weight shift to right;Antalgic     General Gait Details: Slow, steady gait with RW and supervision for safety; cues for RW proximity as well as increasing step length to improve gait fluidity; pt with good awareness of RLE 50% PWB precautions; able to increase gait speed with  cues   Stairs Stairs:  (Pt plans to have family bump her up in w/c)           Wheelchair Mobility    Modified Rankin (Stroke Patients Only)       Balance Overall balance assessment: Needs assistance Sitting-balance support: No upper extremity supported;Feet supported Sitting balance-Leahy Scale: Good     Standing balance support: Bilateral upper extremity supported;During functional activity;No upper extremity supported Standing balance-Leahy Scale: Fair Standing balance comment: Can static stand without UE support                            Cognition Arousal/Alertness: Awake/alert Behavior During Therapy: WFL for tasks assessed/performed Overall Cognitive Status: Within Functional Limits for tasks assessed                                        Exercises Other Exercises Other Exercises: Reviewed [employer] HEP handout - pt able to perform quad sets (long sitting in bed) and LAQ, requires use of strap (gait belt) to perform heel slides and supine hip abd/add; unable to perform seated marching yet    General Comments General comments (skin integrity, edema, etc.): Increased time discussing and demonstrating car transfer; able to recreate car height using bed - pt states she wants to have husband bring step stool (educ on safe technique with this), but ultimately deciding car is not high enough to require step stool into it      Pertinent Vitals/Pain Pain Assessment: Faces  Faces Pain Scale: Hurts a little bit Pain Location: R LE Pain Descriptors / Indicators: Discomfort;Guarding Pain Intervention(s): Monitored during session    Home Living                      Prior Function            PT Goals (current goals can now be found in the care plan section) Progress towards PT goals: Progressing toward goals    Frequency    Min 5X/week      PT Plan Current plan remains appropriate    Co-evaluation               AM-PAC PT "6 Clicks" Mobility   Outcome Measure  Help needed turning from your back to your side while in a flat bed without using bedrails?: None Help needed moving from lying on your back to sitting on the side of a flat bed without using bedrails?: None Help needed moving to and from a bed to a chair (including a wheelchair)?: None Help needed standing up from a chair using your arms (e.g., wheelchair or bedside chair)?: None Help needed to walk in hospital room?: A Little Help needed climbing 3-5 steps with a railing? : A Lot 6 Click Score: 21    End of Session Equipment Utilized During Treatment: Gait belt Activity Tolerance: Patient tolerated treatment well Patient left: in chair;with call bell/phone within reach;with chair alarm set Nurse Communication: Mobility status PT Visit Diagnosis: Muscle weakness (generalized) (M62.81);History of falling (Z91.81);Difficulty in walking, not elsewhere classified (R26.2);Other abnormalities of gait and mobility (R26.89);Pain Pain - Right/Left: Right Pain - part of body: Leg     Time: 9449-6759 PT Time Calculation (min) (ACUTE ONLY): 30 min  Charges:  $Gait Training: 8-22 mins $Therapeutic Exercise: 8-22 mins                     Ina Homes, PT, DPT Acute Rehabilitation Services  Pager [phone] Office [phone]  Malachy Chamber 04/24/2020, 1:04 PM

## 2020-04-24 NOTE — Plan of Care (Signed)

## 2020-04-25 LAB — SARS CORONAVIRUS 2 (TAT 6-24 HRS): SARS Coronavirus 2: NEGATIVE

## 2020-04-25 NOTE — Progress Notes (Signed)
Physical Therapy Treatment Patient Details Name: Laurie Atkins MRN: [id number] DOB: [birth year] Today's Date: 04/25/2020    History of Present Illness Pt is a 60 y/o female admitted 04/17/20 after fall sustaining R femur fx. S/p IM nail R femur on 1/18. PMH significant for aortic atherosclerosis.    PT Comments    Patient making good progress towards physical therapy goals. Session focused on stair training as patient plans to go home today. Patient negotiated 2 stairs with L handrail and min guard. Patient ambulated 200' with supervision and RW, improved gait speed and fluidity. Patient continues to be limited by generalized weakness, decreased activity tolerance, and impaired balance. HHPT at d/c continues to be appropriate.    Follow Up Recommendations  Home health PT;Supervision - Intermittent     Equipment Recommendations  Rolling Reann Dobias with 5" wheels;3in1 (PT)    Recommendations for Other Services       Precautions / Restrictions Precautions Precautions: Fall Restrictions Weight Bearing Restrictions: Yes RLE Weight Bearing: Partial weight bearing RLE Partial Weight Bearing Percentage or Pounds: 50    Mobility  Bed Mobility               General bed mobility comments: in recliner upon arrival  Transfers Overall transfer level: Modified independent Equipment used: Rolling Natisha Trzcinski (2 wheeled) Transfers: Sit to/from Stand              Ambulation/Gait Ambulation/Gait assistance: Supervision Gait Distance (Feet): 200 Feet Assistive device: Rolling Castiel Lauricella (2 wheeled) Gait Pattern/deviations: Step-through pattern;Decreased stride length;Decreased weight shift to right;Antalgic     General Gait Details: patient with increased gait speed and good awareness for WB precautions. Improved gait fluidity   Stairs Stairs: Yes Stairs assistance: Min guard Stair Management: One rail Left;Step to pattern;Forwards Number of Stairs: 2 General stair comments: Educated  patient on safe stair negotiation with WB restrictions. Heavy use of rail on L with ascending. Patient with improved stair negotiation   Wheelchair Mobility    Modified Rankin (Stroke Patients Only)       Balance Overall balance assessment: Needs assistance Sitting-balance support: No upper extremity supported;Feet supported Sitting balance-Leahy Scale: Good     Standing balance support: Bilateral upper extremity supported;During functional activity;No upper extremity supported Standing balance-Leahy Scale: Fair                              Cognition Arousal/Alertness: Awake/alert Behavior During Therapy: WFL for tasks assessed/performed Overall Cognitive Status: Within Functional Limits for tasks assessed                                        Exercises      General Comments        Pertinent Vitals/Pain Pain Assessment: Faces Faces Pain Scale: Hurts a little bit Pain Location: R LE Pain Descriptors / Indicators: Discomfort;Guarding Pain Intervention(s): Monitored during session    Home Living                      Prior Function            PT Goals (current goals can now be found in the care plan section) Acute Rehab PT Goals Patient Stated Goal: to walk PT Goal Formulation: With patient Time For Goal Achievement: 05/02/20 Potential to Achieve Goals: Good Progress towards PT goals: Progressing toward goals  Frequency    Min 5X/week      PT Plan Current plan remains appropriate    Co-evaluation              AM-PAC PT "6 Clicks" Mobility   Outcome Measure  Help needed turning from your back to your side while in a flat bed without using bedrails?: None Help needed moving from lying on your back to sitting on the side of a flat bed without using bedrails?: None Help needed moving to and from a bed to a chair (including a wheelchair)?: None Help needed standing up from a chair using your arms (e.g.,  wheelchair or bedside chair)?: None Help needed to walk in hospital room?: A Little Help needed climbing 3-5 steps with a railing? : A Little 6 Click Score: 22    End of Session Equipment Utilized During Treatment: Gait belt Activity Tolerance: Patient tolerated treatment well Patient left: Other (comment) (passed off to OT upon entering room from ambulation) Nurse Communication: Mobility status PT Visit Diagnosis: Muscle weakness (generalized) (M62.81);History of falling (Z91.81);Difficulty in walking, not elsewhere classified (R26.2);Other abnormalities of gait and mobility (R26.89);Pain Pain - Right/Left: Right Pain - part of body: Leg     Time: 9381-0175 PT Time Calculation (min) (ACUTE ONLY): 17 min  Charges:  $Therapeutic Activity: 8-22 mins                     Syniah Berne A. Dan Humphreys PT, DPT Acute Rehabilitation Services Pager [phone] Office [phone]    Elissa Lovett 04/25/2020, 12:34 PM

## 2020-04-25 NOTE — Progress Notes (Signed)
Pt was given her AVS discharge summary and went over with her. Pt equipment in room to go home with her. Pt had no further questions.

## 2020-04-25 NOTE — Progress Notes (Signed)
Occupational Therapy Treatment Patient Details Name: Laurie Atkins MRN: [id number] DOB: April 01, 1960 Today's Date: 04/25/2020    History of present illness Pt is a 60 y/o female admitted 04/17/20 after fall sustaining R femur fx. S/p IM nail R femur on 1/18. PMH significant for aortic atherosclerosis.   OT comments  Pt making very good progress with functional goals. Pt scheduled to d/c home this afternoon and will have all necessary DME and A/E  Follow Up Recommendations  Home health OT    Equipment Recommendations  3 in 1 bedside commode;Other (comment) (RW, ADL A/E kit)    Recommendations for Other Services      Precautions / Restrictions Precautions Precautions: Fall Restrictions Weight Bearing Restrictions: Yes RLE Weight Bearing: Partial weight bearing RLE Partial Weight Bearing Percentage or Pounds: 50%       Mobility Bed Mobility               General bed mobility comments: pt up with PT upon arrival  Transfers Overall transfer level: Modified independent Equipment used: Rolling walker (2 wheeled) Transfers: Sit to/from Stand                Balance Overall balance assessment: Needs assistance Sitting-balance support: No upper extremity supported;Feet supported Sitting balance-Leahy Scale: Good     Standing balance support: Bilateral upper extremity supported;During functional activity;No upper extremity supported Standing balance-Leahy Scale: Fair                             ADL either performed or assessed with clinical judgement   ADL Overall ADL's : Needs assistance/impaired     Grooming: Wash/dry hands;Wash/dry face;Standing;Supervision/safety       Lower Body Bathing: Sitting/lateral leans;Sit to/from stand;With caregiver independent assisting;Minimal assistance       Lower Body Dressing: Sitting/lateral leans;Sit to/from stand;With caregiver independent assisting;Minimal assistance   Toilet Transfer:  Ambulation;RW;Comfort height toilet;Modified Independent   Toileting- Clothing Manipulation and Hygiene: Supervision/safety;Sit to/from stand   Tub/ Banker: Supervision/safety   Functional mobility during ADLs: Rolling walker;Supervision/safety       Vision Baseline Vision/History: Wears glasses Wears Glasses: Reading only Patient Visual Report: No change from baseline     Perception     Praxis      Cognition Arousal/Alertness: Awake/alert Behavior During Therapy: WFL for tasks assessed/performed Overall Cognitive Status: Within Functional Limits for tasks assessed                                          Exercises     Shoulder Instructions       General Comments      Pertinent Vitals/ Pain       Pain Assessment: 0-10 Pain Score: 2  Faces Pain Scale: Hurts a little bit Pain Location: R LE Pain Descriptors / Indicators: Discomfort;Guarding Pain Intervention(s): Monitored during session;Repositioned  Home Living                                          Prior Functioning/Environment              Frequency  Min 2X/week        Progress Toward Goals  OT Goals(current goals can now be found in the care plan section)  Progress  towards OT goals: Progressing toward goals  Acute Rehab OT Goals Patient Stated Goal: to walk  Plan Discharge plan remains appropriate    Co-evaluation                 AM-PAC OT "6 Clicks" Daily Activity     Outcome Measure   Help from another person eating meals?: None Help from another person taking care of personal grooming?: A Little Help from another person toileting, which includes using toliet, bedpan, or urinal?: A Little Help from another person bathing (including washing, rinsing, drying)?: A Little Help from another person to put on and taking off regular upper body clothing?: None Help from another person to put on and taking off regular lower body clothing?: A  Little 6 Click Score: 20    End of Session Equipment Utilized During Treatment: Gait belt;Rolling walker;Other (comment) (shower seat, 3 in 1 over toilet)  OT Visit Diagnosis: Other abnormalities of gait and mobility (R26.89);Muscle weakness (generalized) (M62.81);History of falling (Z91.81);Pain Pain - Right/Left: Right Pain - part of body: Leg   Activity Tolerance Patient tolerated treatment well   Patient Left in chair;with call bell/phone within reach   Nurse Communication          Time: 1201-1217 OT Time Calculation (min): 16 min  Charges: OT General Charges $OT Visit: 1 Visit OT Treatments $Self Care/Home Management : 8-22 mins     Britt Bottom 04/25/2020, 2:55 PM

## 2020-04-25 NOTE — Plan of Care (Signed)
  Problem: Education: Goal: Knowledge of General Education information will improve Description: Including pain rating scale, medication(s)/side effects and non-pharmacologic comfort measures Outcome: Adequate for Discharge   Problem: Health Behavior/Discharge Planning: Goal: Ability to manage health-related needs will improve Outcome: Adequate for Discharge   Problem: Clinical Measurements: Goal: Ability to maintain clinical measurements within normal limits will improve Outcome: Adequate for Discharge Goal: Will remain free from infection Outcome: Adequate for Discharge Goal: Diagnostic test results will improve Outcome: Adequate for Discharge Goal: Respiratory complications will improve Outcome: Adequate for Discharge Goal: Cardiovascular complication will be avoided Outcome: Adequate for Discharge   Problem: Activity: Goal: Risk for activity intolerance will decrease Outcome: Adequate for Discharge   Problem: Nutrition: Goal: Adequate nutrition will be maintained Outcome: Adequate for Discharge   Problem: Coping: Goal: Level of anxiety will decrease Outcome: Adequate for Discharge   Problem: Elimination: Goal: Will not experience complications related to bowel motility Outcome: Adequate for Discharge Goal: Will not experience complications related to urinary retention Outcome: Adequate for Discharge   Problem: Pain Managment: Goal: General experience of comfort will improve Outcome: Adequate for Discharge   Problem: Safety: Goal: Ability to remain free from injury will improve Outcome: Adequate for Discharge   Problem: Skin Integrity: Goal: Risk for impaired skin integrity will decrease Outcome: Adequate for Discharge   Problem: Acute Rehab PT Goals(only PT should resolve) Goal: Pt Will Go Supine/Side To Sit Outcome: Adequate for Discharge Goal: Patient Will Transfer Sit To/From Stand Outcome: Adequate for Discharge Goal: Pt Will Ambulate Outcome: Adequate  for Discharge Goal: Pt Will Go Up/Down Stairs Outcome: Adequate for Discharge   Problem: Acute Rehab OT Goals (only OT should resolve) Goal: Pt. Will Perform Grooming Outcome: Adequate for Discharge Goal: Pt. Will Perform Upper Body Bathing Outcome: Adequate for Discharge Goal: Pt. Will Perform Lower Body Bathing Outcome: Adequate for Discharge Goal: Pt. Will Perform Upper Body Dressing Outcome: Adequate for Discharge Goal: Pt. Will Perform Lower Body Dressing Outcome: Adequate for Discharge Goal: Pt. Will Transfer To Toilet Outcome: Adequate for Discharge Goal: Pt. Will Perform Toileting-Clothing Manipulation Outcome: Adequate for Discharge Goal: Pt. Will Perform Tub/Shower Transfer Outcome: Adequate for Discharge

## 2020-04-25 NOTE — Discharge Instructions (Signed)
-   Maintain postoperative bandages until your follow-up appointment.  You may shower with these in place.  However, if these become saturated or soiled you should remove and replace with daily dry dressings.  -You are appropriate for 50% weightbearing to the right lower extremity.  You should utilize your walker and/or crutches for assistance.  -You should apply ice to the right hip and thigh as needed.  You should do this for 20 to 30 minutes out of each hour that you are able, around-the-clock.  -For mild to moderate pain use Tylenol and Advil as necessary.  You should alternate these around-the-clock.  You should then use oxycodone as necessary for breakthrough pain.  -For the prevention of blood clot she should take an 81 mg aspirin once per day x6 weeks.  -Place warm compresses at the left arm IV site, call the office if worsening spreading redness or pain.   -Return to see Dr. Aundria Rud in 7-10 days for routine postoperative care.[phone] [facility]

## 2020-04-25 NOTE — TOC Transition Note (Signed)
Transition of Care [facility]) - CM/SW Discharge Note   Patient Details  Name: Laurie Atkins MRN: [id number] Date of Birth: [birth year]  Transition of Care [facility]) CM/SW Contact:  Epifanio Lesches, RN Phone Number: 04/25/2020, 2:27 PM   Clinical Narrative:    Patient will DC to: home Anticipated DC date: 04/25/2020 Family notified: husband Transport by: car  Presented s/p fall in parking lot @ work. Suffered R femur fx. From home with husband . PTA independent with ADL's, no DME usage.                     -s/p  IMN  R femur, 1/18  Workman's compensation case :[employer], Diane CM , # [phone]. Fax # [phone], [email or web address]  Home health and DME orders noted and faxed to Diane CM., along with clinicals. Per Diane rolling walker and W/C will be delivered to bedside prior to d/c.... home health services will be initiated once pt has d/c to home.   Per MD patient ready for DC today. RN, patient,and patient's family  aware of DC.  Pt without Rx med concerns.  Post hospital f/u noted on AVS.  RNCM will sign off for now as intervention is no longer needed. Please consult [place] again if new needs arise.    Final next level of care: Home w Home Health Services Barriers to Discharge: No Barriers Identified   Patient Goals and CMS Choice        Discharge Placement                       Discharge Plan and Services                                     Social Determinants of Health (SDOH) Interventions     Readmission Risk Interventions No flowsheet data found.

## 2020-04-25 NOTE — Progress Notes (Signed)
Subjective: Laurie Atkins is a 60 year old female 8 days status post a right IM femur nail for a subtrochanteric fracture.  She fell while walking in the parking lot at work on 04/17/2020.  Today,she reports she is doing well, her DME will be delivered around 3-4 per her NCM. She denies numbness or tingling. Denies fever or chills. Overall doing well, ready to go home.   She lives at home with her daughter and husband.  She reports that when she goes home should be alone during the day as her child would be back at school and her husband works during the day.  Objective:   VITALS:   Vitals:   04/24/20 1422 04/24/20 1900 04/25/20 0500 04/25/20 0742  BP: 115/60 (!) 110/58 (!) 113/59 129/64  Pulse: 81 78 72 76  Resp: 18 18 16 17   Temp: 99.3 F (37.4 C) 99.7 F (37.6 C) 98.8 F (37.1 C) (!) 97.5 F (36.4 C)  TempSrc: Oral Oral Oral Oral  SpO2: 100% 99% 94% 95%  Weight:      Height:       Constitutional: General Appearance: healthy-appearing, well-nourished, and well-developed.Sitting up in hospital chair.  Level of Distress: no acute distress. Eyes: Lens (normal) clear: both eyes. Head: Head: normocephalic and atraumatic. Lungs: Respiratory effort: no dyspnea. Skin: Inspection and palpation: no rash, lesions Neurologic: Cranial Nerves: grossly intact. Sensation: grossly intact. Psychiatric: Insight: good judgement and insight. Mental Status: normal mood and affect and active and alert. Orientation: to time, place, and person.    Right Lower Extremity:  Neurologically intact Neurovascular intact Sensation intact distally Intact pulses distally Dorsiflexion/Plantar flexion intact Incision: dressing C/D/I  Dressings intact without drainage.  Improved Swelling of the proximal thigh, compartment soft. Calf soft and nontender.  Knee ROM 0-100 today , DP2+   Lab Results  Component Value Date   WBC 9.9 04/18/2020   HGB 9.9 (L) 04/18/2020   HCT 29.4 (L) 04/18/2020   MCV 86.5  04/18/2020   PLT 203 04/18/2020   BMET    Component Value Date/Time   NA 135 04/18/2020 0537   NA 141 09/22/2018 1141   K 4.6 04/18/2020 0537   CL 102 04/18/2020 0537   CO2 22 04/18/2020 0537   GLUCOSE 145 (H) 04/18/2020 0537   BUN 16 04/18/2020 0537   BUN 12 09/22/2018 1141   CREATININE 0.61 04/24/2020 0514   CALCIUM 8.5 (L) 04/18/2020 0537   GFRNONAA >60 04/24/2020 0514   GFRAA 118 09/22/2018 1141     Assessment/Plan: 8 Days Post-Op   Active Problems:   Closed fracture of right femur (HCC)   Closed displaced subtrochanteric fracture of right femur (HCC)   Advance diet Up with therapy  NCM called to ensure DME is being dropped off, ramp measurements are being taken today. Wheelchair and walker are being brought to hospital, 3 in 1 will be dropped off at her house.   Plan for Discharge today one DME is delivered.    DME orders placed for discharge including ramp, 3 in 1, wheelchair, and walker.HHPT/HHOT order also placed. These should be deliver this afternoon around 3-4pm  Superficial thrombophlebitis noted on the left antecubital fossa, small area of purulent material noted at the IV site. Mild erythema, no streaking. Recommend cold compresses, watching for increased purulence or spreading redness. If worsening may recommend antibiotics but at this time this should improve with removal of IV and time. Today, this is improving.   Pain medications as needed.  Advised to work  on ankle and knee range of motion as tolerated.  Weightbearing Status: 50% weightbearing to the RLE DVT Prophylaxis: Lovenox inpatient, Aspirin 81mg  outpatient  Return to [facility] 7-10 days s/p discharge.   04/25/2020, 12:37 PM  04/27/2020 PA-C  Physician Assistant with Dr. Dion Saucier [place]

## 2020-04-25 NOTE — Discharge Summary (Signed)
Patient ID: Laurie Atkins MRN: [id number] DOB/AGE: 06-13-1960 60 y.o.  Admit date: 04/17/2020 Discharge date:   Primary Diagnosis: Closed displaced subtrochanteric fracture of the right femur, status post IM nailing. Admission Diagnoses:  History reviewed. No pertinent past medical history. Discharge Diagnoses:   Active Problems:   Closed fracture of right femur (HCC)   Closed displaced subtrochanteric fracture of right femur (HCC)  Estimated body mass index is 23.52 kg/m as calculated from the following:   Height as of this encounter: 5\' 4"  (1.626 m).   Weight as of this encounter: 62.1 kg.  Procedure:  Procedure(s) (LRB): INTRAMEDULLARY (IM) NAIL FEMORAL (Right)   Consults: None  HPI: Laurie Atkins is a 60 year old female who presented to the emergency room on 04/17/2020 after a fall in the parking lot at work.  She suffered a subtrochanteric right femur fracture.  This was promptly treated with a IM nail by Dr. 04/19/2020 on 04/17/2020.  Patient has been admitted for physical therapy and postoperative care.  This is a 04/19/2020. Laboratory Data: Admission on 04/17/2020  Component Date Value Ref Range Status  . WBC 04/17/2020 7.3  4.0 - 10.5 K/uL Final  . RBC 04/17/2020 4.63  3.87 - 5.11 MIL/uL Final  . Hemoglobin 04/17/2020 12.8  12.0 - 15.0 g/dL Final  . HCT 04/19/2020 39.7  36.0 - 46.0 % Final  . MCV 04/17/2020 85.7  80.0 - 100.0 fL Final  . MCH 04/17/2020 27.6  26.0 - 34.0 pg Final  . MCHC 04/17/2020 32.2  30.0 - 36.0 g/dL Final  . RDW 04/19/2020 13.0  11.5 - 15.5 % Final  . Platelets 04/17/2020 183  150 - 400 K/uL Final  . nRBC 04/17/2020 0.0  0.0 - 0.2 % Final   Performed at [facility], [address], [place], [place]  . Sodium 04/17/2020 142  135 - 145 mmol/L Final  . Potassium 04/17/2020 2.2* 3.5 - 5.1 mmol/L Final   Comment: CRITICAL RESULT CALLED TO, READ BACK BY AND VERIFIED WITH: LASHER,G RN @1434  ON 04/19/2020 BY FLEMINGS   .  Chloride 04/17/2020 119* 98 - 111 mmol/L Final  . CO2 04/17/2020 17* 22 - 32 mmol/L Final  . Glucose, Bld 04/17/2020 77  70 - 99 mg/dL Final   Glucose reference range applies only to samples taken after fasting for at least 8 hours.  . BUN 04/17/2020 8  6 - 20 mg/dL Final  . Creatinine, Ser 04/17/2020 0.40* 0.44 - 1.00 mg/dL Final  . Calcium 04/19/2020 5.5* 8.9 - 10.3 mg/dL Final   Comment: CRITICAL RESULT CALLED TO, READ BACK BY AND VERIFIED WITH: LASHER,G RN @1434  ON 04/19/2020 BY FLEMINGS   . GFR, Estimated 04/17/2020 >60  >60 mL/min Final   Comment: (NOTE) Calculated using the CKD-EPI Creatinine Equation (2021)   . Anion gap 04/17/2020 6  5 - 15 Final   Performed at [facility], [address] [place], [facility] [address]  . SARS Coronavirus 2 by RT PCR 04/17/2020 NEGATIVE  NEGATIVE Final   Comment: (NOTE) SARS-CoV-2 target nucleic acids are NOT DETECTED.  The SARS-CoV-2 RNA is generally detectable in upper respiratory specimens during the acute phase of infection. The lowest concentration of SARS-CoV-2 viral copies this assay can detect is 138 copies/mL. A negative result does not preclude SARS-Cov-2 infection and should not be used as the sole basis for treatment or other patient management decisions. A negative result may occur with  improper specimen collection/handling, submission of specimen  other than nasopharyngeal swab, presence of viral mutation(s) within the areas targeted by this assay, and inadequate number of viral copies(<138 copies/mL). A negative result must be combined with clinical observations, patient history, and epidemiological information. The expected result is Negative.  Fact Sheet for Patients:  BloggerCourse.com  Fact Sheet for Healthcare Providers:  SeriousBroker.it  This test is no                          t yet approved or cleared by the [place] FDA and  has been authorized for  detection and/or diagnosis of SARS-CoV-2 by FDA under an Emergency Use Authorization (EUA). This EUA will remain  in effect (meaning this test can be used) for the duration of the COVID-19 declaration under Section 564(b)(1) of the Act, 21 U.S.C.section 360bbb-3(b)(1), unless the authorization is terminated  or revoked sooner.      . Influenza A by PCR 04/17/2020 NEGATIVE  NEGATIVE Final  . Influenza B by PCR 04/17/2020 NEGATIVE  NEGATIVE Final   Comment: (NOTE) The Xpert Xpress SARS-CoV-2/FLU/RSV plus assay is intended as an aid in the diagnosis of influenza from Nasopharyngeal swab specimens and should not be used as a sole basis for treatment. Nasal washings and aspirates are unacceptable for Xpert Xpress SARS-CoV-2/FLU/RSV testing.  Fact Sheet for Patients: BloggerCourse.com  Fact Sheet for Healthcare Providers: SeriousBroker.it  This test is not yet approved or cleared by the [place] FDA and has been authorized for detection and/or diagnosis of SARS-CoV-2 by FDA under an Emergency Use Authorization (EUA). This EUA will remain in effect (meaning this test can be used) for the duration of the COVID-19 declaration under Section 564(b)(1) of the Act, 21 U.S.C. section 360bbb-3(b)(1), unless the authorization is terminated or revoked.  Performed at [facility], [address], [place], [place] [address]   . Sodium 04/17/2020 139  135 - 145 mmol/L Final  . Potassium 04/17/2020 3.7  3.5 - 5.1 mmol/L Final   NO VISIBLE HEMOLYSIS  . Chloride 04/17/2020 104  98 - 111 mmol/L Final  . CO2 04/17/2020 25  22 - 32 mmol/L Final  . Glucose, Bld 04/17/2020 111* 70 - 99 mg/dL Final   Glucose reference range applies only to samples taken after fasting for at least 8 hours.  . BUN 04/17/2020 11  6 - 20 mg/dL Final  . Creatinine, Ser 04/17/2020 0.70  0.44 - 1.00 mg/dL Final  . Calcium 60/45/4098 9.3  8.9 - 10.3 mg/dL Final    DELTA CHECK NOTED  . Total Protein 04/17/2020 6.6  6.5 - 8.1 g/dL Final  . Albumin 11/91/4782 4.0  3.5 - 5.0 g/dL Final  . AST 95/62/1308 22  15 - 41 U/L Final  . ALT 04/17/2020 22  0 - 44 U/L Final  . Alkaline Phosphatase 04/17/2020 39  38 - 126 U/L Final  . Total Bilirubin 04/17/2020 0.6  0.3 - 1.2 mg/dL Final  . GFR, Estimated 04/17/2020 >60  >60 mL/min Final   Comment: (NOTE) Calculated using the CKD-EPI Creatinine Equation (2021)   . Anion gap 04/17/2020 10  5 - 15 Final   Performed at [facility], [address], [place], [place] [address]  . Magnesium 04/17/2020 1.8  1.7 - 2.4 mg/dL Final   Performed at [facility], [address], [place], [place] [address]  . HIV Screen 4th Generation wRfx 04/18/2020 Non Reactive  Non Reactive Final   Performed at  [facility], [address] [place], [place], [place] [address]  . WBC 04/18/2020 9.9  4.0 - 10.5 K/uL Final  . RBC 04/18/2020 3.40* 3.87 - 5.11 MIL/uL Final  . Hemoglobin 04/18/2020 9.9* 12.0 - 15.0 g/dL Final  . HCT 09/47/0962 29.4* 36.0 - 46.0 % Final  . MCV 04/18/2020 86.5  80.0 - 100.0 fL Final  . MCH 04/18/2020 29.1  26.0 - 34.0 pg Final  . MCHC 04/18/2020 33.7  30.0 - 36.0 g/dL Final  . RDW 83/66/2947 13.2  11.5 - 15.5 % Final  . Platelets 04/18/2020 203  150 - 400 K/uL Final  . nRBC 04/18/2020 0.0  0.0 - 0.2 % Final   Performed at [facility], [address] [place], [place] [address]  . Sodium 04/18/2020 135  135 - 145 mmol/L Final  . Potassium 04/18/2020 4.6  3.5 - 5.1 mmol/L Final  . Chloride 04/18/2020 102  98 - 111 mmol/L Final  . CO2 04/18/2020 22  22 - 32 mmol/L Final  . Glucose, Bld 04/18/2020 145* 70 - 99 mg/dL Final   Glucose reference range applies only to samples taken after fasting for at least 8 hours.  . BUN 04/18/2020 16  6 - 20 mg/dL Final  . Creatinine, Ser 04/18/2020 0.89  0.44 - 1.00 mg/dL Final  . Calcium 03/54/6568 8.5* 8.9 - 10.3 mg/dL Final  . GFR, Estimated 04/18/2020 >60   >60 mL/min Final   Comment: (NOTE) Calculated using the CKD-EPI Creatinine Equation (2021)   . Anion gap 04/18/2020 11  5 - 15 Final   Performed at [facility], [address] [place], [place] [address]  . Creatinine, Ser 04/24/2020 0.61  0.44 - 1.00 mg/dL Final  . GFR, Estimated 04/24/2020 >60  >60 mL/min Final   Comment: (NOTE) Calculated using the CKD-EPI Creatinine Equation (2021) Performed at [facility], [address], [place], [place] [address]      X-Rays:DG Pelvis 1-2 Views  Result Date: 04/17/2020 CLINICAL DATA:  Fall onto ice, right hip pain EXAM: RIGHT FEMUR 2 VIEWS; PELVIS - 1-2 VIEW COMPARISON:  None. FINDINGS: Acute mildly comminuted fracture of the proximal right femur with medial displacement of the lesser trochanter. Oblique fracture extension to the subtrochanteric aspect of the proximal right femur with up to 1/2 shaft width of medial displacement. Subtrochanteric fracture is anteriorly angulated. Hip and knee joints intact without dislocation. Bony pelvis intact without fracture or diastasis. Incidental note of a 13 mm circumscribed area of sclerosis along the right superior pubic ramus likely a benign bone island. IMPRESSION: 1. Acute mildly comminuted and displaced fracture of the proximal right femur as described. No dislocation. 2. Bony pelvis intact without fracture or diastasis. Electronically Signed   By: Duanne Guess D.O.   On: 04/17/2020 14:17   DG C-Arm 1-60 Min  Result Date: 04/17/2020 CLINICAL DATA:  Surgery, intramedullary nail placement EXAM: RIGHT FEMUR 2 VIEWS; DG C-ARM 1-60 MIN COMPARISON:  Radiograph 04/17/2020 FLUOROSCOPY TIME:  1 minutes 55 seconds 13.65 mGy Six static images FINDINGS: Sequential images redemonstrated comminuted fracture of the proximal femur with intertrochanteric extension. Subsequent placement of a right femoral intramedullary nail and partially threaded transcervical fixation pin. No acute complication is evident.  Slight residual medial displacement of the fracture fragment comprising the lesser trochanter. No acute complication is seen. IMPRESSION: Interval placement of a right femoral intramedullary nail and transcervical fixation pin without acute complication. Correlate with operative report. Electronically Signed  By: Kreg Shropshire M.D.   On: 04/17/2020 21:18   DG FEMUR, MIN 2 VIEWS RIGHT  Result Date: 04/17/2020 CLINICAL DATA:  Surgery, intramedullary nail placement EXAM: RIGHT FEMUR 2 VIEWS; DG C-ARM 1-60 MIN COMPARISON:  Radiograph 04/17/2020 FLUOROSCOPY TIME:  1 minutes 55 seconds 13.65 mGy Six static images FINDINGS: Sequential images redemonstrated comminuted fracture of the proximal femur with intertrochanteric extension. Subsequent placement of a right femoral intramedullary nail and partially threaded transcervical fixation pin. No acute complication is evident. Slight residual medial displacement of the fracture fragment comprising the lesser trochanter. No acute complication is seen. IMPRESSION: Interval placement of a right femoral intramedullary nail and transcervical fixation pin without acute complication. Correlate with operative report. Electronically Signed   By: Kreg Shropshire M.D.   On: 04/17/2020 21:18   DG FEMUR, MIN 2 VIEWS RIGHT  Result Date: 04/17/2020 CLINICAL DATA:  Fall onto ice, right hip pain EXAM: RIGHT FEMUR 2 VIEWS; PELVIS - 1-2 VIEW COMPARISON:  None. FINDINGS: Acute mildly comminuted fracture of the proximal right femur with medial displacement of the lesser trochanter. Oblique fracture extension to the subtrochanteric aspect of the proximal right femur with up to 1/2 shaft width of medial displacement. Subtrochanteric fracture is anteriorly angulated. Hip and knee joints intact without dislocation. Bony pelvis intact without fracture or diastasis. Incidental note of a 13 mm circumscribed area of sclerosis along the right superior pubic ramus likely a benign bone island.  IMPRESSION: 1. Acute mildly comminuted and displaced fracture of the proximal right femur as described. No dislocation. 2. Bony pelvis intact without fracture or diastasis. Electronically Signed   By: Duanne Guess D.O.   On: 04/17/2020 14:17   DG FEMUR PORT, MIN 2 VIEWS RIGHT  Result Date: 04/17/2020 CLINICAL DATA:  Postop EXAM: RIGHT FEMUR PORTABLE 2 VIEW COMPARISON:  04/17/2020 FINDINGS: Interval intramedullary rod and distal screw fixation of the femur for comminuted proximal femoral fracture. Displaced lesser trochanteric fracture fragment is noted. Overall decreased fracture displacement compared to previous. Air within the soft tissues consistent with recent surgery. Smooth sclerosis in the right superior pubic ramus, possible bone island. IMPRESSION: Interval intramedullary rod and screw fixation of the proximal femur for comminuted proximal femoral fracture. With overall decreased displacement and angulation. Displaced lesser trochanteric fracture fragment is noted. Electronically Signed   By: Jasmine Pang M.D.   On: 04/17/2020 21:41    EKG: Orders placed or performed during the hospital encounter of 04/17/20  . ED EKG  . ED EKG     Hospital Course: Laurie Atkins is a 60 y.o. who was admitted to Hospital. They were brought to the operating room on 04/17/2020 and underwent Procedure(s): INTRAMEDULLARY (IM) NAIL FEMORAL.  Patient tolerated the procedure well and was later transferred to the recovery room and then to the orthopaedic floor for postoperative care.  They were given PO and IV analgesics for pain control following their surgery.  They were given 24 hours of postoperative antibiotics of  Anti-infectives (From admission, onward)   Start     Dose/Rate Route Frequency Ordered Stop   04/17/20 1700  ceFAZolin (ANCEF) IVPB 2g/100 mL premix  Status:  Discontinued        2 g 200 mL/hr over 30 Minutes Intravenous To ShortStay Surgical 04/17/20 1657 04/17/20 1746     and started on DVT  prophylaxis in the form of Lovenox.   PT and OT were ordered for total joint protocol.  Discharge planning consulted to help with postop disposition and equipment  needs.  Patient had an uneventful night on the evening of surgery.  They started to get up OOB with therapy on day one.Continued to work with therapy into day two - eight.  Dressing was changed on day two and the incision was healing well without signs of infection. Two of her bandages remained intact and one was changed due to normal post operative drainage.  Patient was seen by PT and OT during her 8-day hospital stay.  We worked in coordination with her nurse case manager to get appropriate DME including a home ramp, rolling walker, wheelchair, and 3 and 1 to be set up at her house.  The wheelchair and a walker were brought to the the hospital for safe discharge home.  Patient was recommended to go home with home health PT and OT.  Patient was seen in rounds and was ready to go home.  Medications have been sent to her pharmacy.  She will take aspirin 81 mg daily for DVT prophylaxis.   Diet: Regular diet Activity:PWB Follow-up:in 7-10 days Disposition - Home Discharged Condition: good    Allergies as of 04/25/2020   No Known Allergies     Medication List    TAKE these medications   diphenhydrAMINE 25 MG tablet Commonly known as: BENADRYL Take 25 mg by mouth as needed for allergies.   Magnesium 100 MG Caps Take 100 mg by mouth daily.   multivitamin capsule Take 1 capsule by mouth daily. Centrum Silver   ondansetron 4 MG disintegrating tablet Commonly known as: Zofran ODT Take 1 tablet (4 mg total) by mouth every 8 (eight) hours as needed.   oxyCODONE 5 MG immediate release tablet Commonly known as: Roxicodone Take 1 tablet (5 mg total) by mouth every 4 (four) hours as needed for severe pain.   vitamin B-12 1000 MCG tablet Commonly known as: CYANOCOBALAMIN Take 1,000 mcg by mouth daily.            Durable Medical  Equipment  (From admission, onward)         Start     Ordered   04/23/20 1333  For home use only DME standard manual wheelchair with seat cushion  Once       Comments: Patient suffers from right hip fracture which impairs their ability to perform daily activities l.  A cane, crutch, or walker will not resolve issue with performing activities of daily living. A wheelchair will allow patient to safely perform daily activities. Patient can safely propel the wheelchair in the home or has a caregiver who can provide assistance. Length of need 6 months . Accessories: elevating leg rests (ELRs), wheel locks, extensions and anti-tippers.   04/23/20 1334   04/23/20 1302  For home use only DME 3 n 1  Once        04/23/20 1302   04/23/20 1302  For home use only DME Walker rolling  Once       Question Answer Comment  Walker: With 5 Inch Wheels   Patient needs a walker to treat with the following condition Subtrochanteric fracture of right femur (HCC)      04/23/20 1302   04/23/20 1300  For home use only DME Access ramp  Once        04/23/20 1302          Follow-up Information    Yolonda Kidaogers, Jason Patrick, MD In 2 weeks.   Specialty: Orthopedic Surgery Why: For suture removal, For wound re-check Contact information: [address]  [address] [place] [address] [phone]               Signed: Dion Saucier PA-C Orthopaedic Surgery 04/25/2020, 12:43 PM

## 2021-01-03 IMAGING — CT CT HEAR MORPH WITH CTA COR WITH SCORE WITH CA WITH CONTRAST AND
2 of 8 series · 4 of 20 positions shown, 5 images · IV contrast (APPLIED)
Comparison: None.
COMPARISON: None.

Addendum:
EXAM:
OVER-READ INTERPRETATION  CT CHEST

The following report is an over-read performed by radiologist Dr.
Chuchin Cape [REDACTED] on 09/27/2018. This
over-read does not include interpretation of cardiac or coronary
anatomy or pathology. The coronary calcium score/coronary CTA
interpretation by the cardiologist is attached.
CLINICAL DATA: Chest pain
Cardiac CTA
MEDICATIONS:
Sub lingual nitro. 4mg x 2
TECHNIQUE: The patient was scanned on a Siemens [REDACTED]ice scanner. Gantry
rotation speed was 250 msecs. Collimation was 0.6 mm. A 100 kV
prospective scan was triggered in the ascending thoracic aorta at
35-75% of the R-R interval. Average HR during the scan was 60 bpm.
The 3D data set was interpreted on a dedicated work station using
MPR, MIP and VRT modes. A total of 80cc of contrast was used.

[Series 9: ts syst sharp 31 % · axial · 0.34mm/px · z∈[+1050,+1101]mm · 2 of 382 slices shown]
[im 128/382  lung]
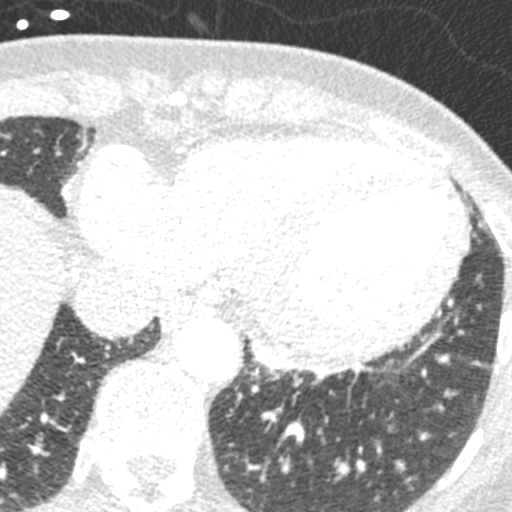
[im 255/382  lung]
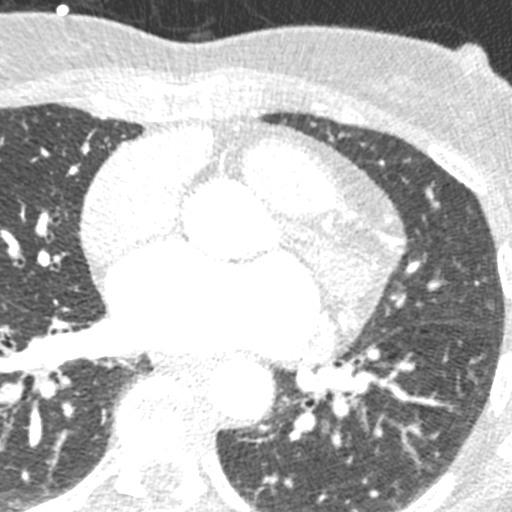

[Series 13: (id) · axial · 0.34mm/px · z∈[+1050,+1101]mm · 2 of 382 slices shown, 3 images]
[im 128/382  vessel]
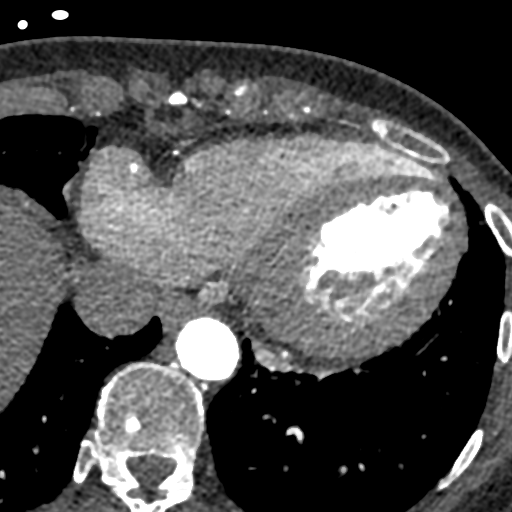
[im 128/382  lung]
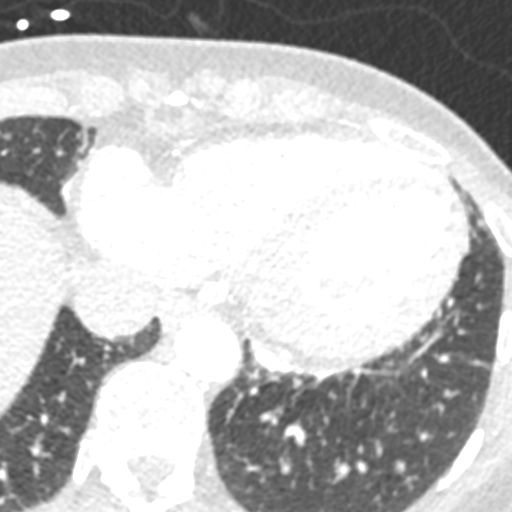
[im 255/382  vessel]
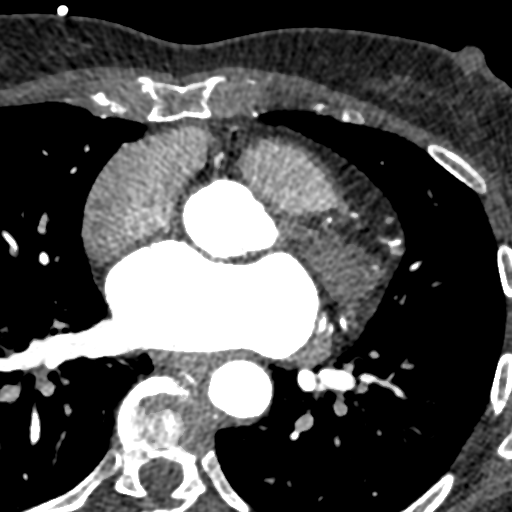

[4 of 20 positions shown; findings below may reference images not displayed]

FINDINGS: Aortic atherosclerosis. Within the visualized portions of the thorax
there are no suspicious appearing pulmonary nodules or masses, there
is no acute consolidative airspace disease, no pleural effusions, no
pneumothorax and no lymphadenopathy. Visualized portions of the
upper abdomen are unremarkable. There are no aggressive appearing
lytic or blastic lesions noted in the visualized portions of the
skeleton.
IMPRESSION: 1.  Aortic Atherosclerosis (6SO5H-FA3.3).
FINDINGS: Non-cardiac: See separate report from [REDACTED].

Pulmonary veins drain normally to the left atrium.

Calcium Score: 0 Agatston units.

Coronary Arteries: Right dominant with no anomalies

LM: No plaque or stenosis.

LAD system:  No plaque or stenosis.

Circumflex system: No plaque or stenosis.

RCA system: No plaque or stenosis.
IMPRESSION: 1. Coronary calcium score 0 Agatston units, suggesting low risk
future cardiac events.

2.  No significant coronary disease noted.

Hirohide Paulao

*** End of Addendum ***
EXAM:
OVER-READ INTERPRETATION  CT CHEST

The following report is an over-read performed by radiologist Dr.
Chuchin Cape [REDACTED] on 09/27/2018. This
over-read does not include interpretation of cardiac or coronary
anatomy or pathology. The coronary calcium score/coronary CTA
interpretation by the cardiologist is attached.
FINDINGS: Aortic atherosclerosis. Within the visualized portions of the thorax
there are no suspicious appearing pulmonary nodules or masses, there
is no acute consolidative airspace disease, no pleural effusions, no
pneumothorax and no lymphadenopathy. Visualized portions of the
upper abdomen are unremarkable. There are no aggressive appearing
lytic or blastic lesions noted in the visualized portions of the
skeleton.
IMPRESSION: 1.  Aortic Atherosclerosis (6SO5H-FA3.3).

## 2021-07-12 ENCOUNTER — Other Ambulatory Visit: Payer: Self-pay | Admitting: Physician Assistant

## 2021-07-12 DIAGNOSIS — S32009A Unspecified fracture of unspecified lumbar vertebra, initial encounter for closed fracture: Secondary | ICD-10-CM

## 2021-07-12 DIAGNOSIS — S32030A Wedge compression fracture of third lumbar vertebra, initial encounter for closed fracture: Secondary | ICD-10-CM

## 2021-07-12 DIAGNOSIS — S32020A Wedge compression fracture of second lumbar vertebra, initial encounter for closed fracture: Secondary | ICD-10-CM

## 2021-07-17 ENCOUNTER — Ambulatory Visit
Admission: RE | Admit: 2021-07-17 | Discharge: 2021-07-17 | Disposition: A | Discharge status: Home / Self Care | Payer: 59 | Source: Ambulatory Visit | Attending: Physician Assistant | Admitting: Physician Assistant

## 2021-07-17 DIAGNOSIS — S32030A Wedge compression fracture of third lumbar vertebra, initial encounter for closed fracture: Secondary | ICD-10-CM

## 2021-07-17 DIAGNOSIS — S32020A Wedge compression fracture of second lumbar vertebra, initial encounter for closed fracture: Secondary | ICD-10-CM

## 2021-07-17 DIAGNOSIS — S32009A Unspecified fracture of unspecified lumbar vertebra, initial encounter for closed fracture: Secondary | ICD-10-CM

## 2021-07-21 ENCOUNTER — Other Ambulatory Visit: Payer: 59
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
## 2021-10-08 ENCOUNTER — Encounter: Payer: Self-pay | Admitting: Physical Therapy

## 2021-10-08 ENCOUNTER — Ambulatory Visit (INDEPENDENT_AMBULATORY_CARE_PROVIDER_SITE_OTHER): Payer: 59 | Admitting: Physical Therapy

## 2021-10-08 ENCOUNTER — Ambulatory Visit: Payer: 59 | Admitting: Physical Therapy

## 2021-10-08 DIAGNOSIS — M6281 Muscle weakness (generalized): Secondary | ICD-10-CM

## 2021-10-08 DIAGNOSIS — R262 Difficulty in walking, not elsewhere classified: Secondary | ICD-10-CM | POA: Diagnosis not present

## 2021-10-08 DIAGNOSIS — M5459 Other low back pain: Secondary | ICD-10-CM | POA: Diagnosis not present

## 2021-10-10 ENCOUNTER — Ambulatory Visit (INDEPENDENT_AMBULATORY_CARE_PROVIDER_SITE_OTHER): Payer: 59 | Admitting: Physical Therapy

## 2021-10-10 ENCOUNTER — Other Ambulatory Visit: Payer: Self-pay | Admitting: Family Medicine

## 2021-10-10 ENCOUNTER — Encounter: Payer: Self-pay | Admitting: Physical Therapy

## 2021-10-10 DIAGNOSIS — G8929 Other chronic pain: Secondary | ICD-10-CM

## 2021-10-10 DIAGNOSIS — M5459 Other low back pain: Secondary | ICD-10-CM

## 2021-10-10 DIAGNOSIS — R262 Difficulty in walking, not elsewhere classified: Secondary | ICD-10-CM | POA: Diagnosis not present

## 2021-10-10 DIAGNOSIS — M6281 Muscle weakness (generalized): Secondary | ICD-10-CM | POA: Diagnosis not present

## 2021-10-10 DIAGNOSIS — S32030A Wedge compression fracture of third lumbar vertebra, initial encounter for closed fracture: Secondary | ICD-10-CM

## 2021-10-10 DIAGNOSIS — R937 Abnormal findings on diagnostic imaging of other parts of musculoskeletal system: Secondary | ICD-10-CM

## 2021-10-10 DIAGNOSIS — S32020A Wedge compression fracture of second lumbar vertebra, initial encounter for closed fracture: Secondary | ICD-10-CM
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
## 2021-10-14 ENCOUNTER — Encounter: Payer: Self-pay | Admitting: Physical Therapy

## 2021-10-14 ENCOUNTER — Ambulatory Visit: Payer: 59 | Admitting: Physical Therapy

## 2021-10-14 DIAGNOSIS — R262 Difficulty in walking, not elsewhere classified: Secondary | ICD-10-CM

## 2021-10-14 DIAGNOSIS — M6281 Muscle weakness (generalized): Secondary | ICD-10-CM

## 2021-10-14 DIAGNOSIS — M5459 Other low back pain: Secondary | ICD-10-CM
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
## 2021-10-17 ENCOUNTER — Encounter: Payer: Self-pay | Admitting: Physical Therapy

## 2021-10-17 ENCOUNTER — Ambulatory Visit: Payer: 59 | Admitting: Physical Therapy

## 2021-10-17 DIAGNOSIS — R262 Difficulty in walking, not elsewhere classified: Secondary | ICD-10-CM | POA: Diagnosis not present

## 2021-10-17 DIAGNOSIS — M5459 Other low back pain: Secondary | ICD-10-CM

## 2021-10-17 DIAGNOSIS — M6281 Muscle weakness (generalized): Secondary | ICD-10-CM | POA: Diagnosis not present
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
## 2021-10-18 ENCOUNTER — Ambulatory Visit
Admission: RE | Admit: 2021-10-18 | Discharge: 2021-10-18 | Disposition: A | Discharge status: Home / Self Care | Payer: 59 | Source: Ambulatory Visit | Attending: Family Medicine | Admitting: Family Medicine

## 2021-10-18 ENCOUNTER — Other Ambulatory Visit: Payer: Self-pay | Admitting: Family Medicine

## 2021-10-18 DIAGNOSIS — S32030A Wedge compression fracture of third lumbar vertebra, initial encounter for closed fracture: Secondary | ICD-10-CM

## 2021-10-18 DIAGNOSIS — R0781 Pleurodynia: Secondary | ICD-10-CM

## 2021-10-18 DIAGNOSIS — S32020A Wedge compression fracture of second lumbar vertebra, initial encounter for closed fracture: Secondary | ICD-10-CM

## 2021-10-18 DIAGNOSIS — M545 Low back pain, unspecified: Secondary | ICD-10-CM

## 2021-10-18 DIAGNOSIS — R937 Abnormal findings on diagnostic imaging of other parts of musculoskeletal system: Secondary | ICD-10-CM

## 2021-10-18 DIAGNOSIS — G8929 Other chronic pain: Secondary | ICD-10-CM

## 2021-10-23 ENCOUNTER — Encounter: Payer: Self-pay | Admitting: Physical Therapy

## 2021-10-23 ENCOUNTER — Ambulatory Visit: Payer: 59 | Admitting: Physical Therapy

## 2021-10-23 DIAGNOSIS — M6281 Muscle weakness (generalized): Secondary | ICD-10-CM | POA: Diagnosis not present

## 2021-10-23 DIAGNOSIS — M5459 Other low back pain: Secondary | ICD-10-CM

## 2021-10-23 DIAGNOSIS — R262 Difficulty in walking, not elsewhere classified: Secondary | ICD-10-CM | POA: Diagnosis not present
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
## 2021-10-24 ENCOUNTER — Ambulatory Visit: Payer: 59 | Admitting: Physical Therapy

## 2021-10-24 ENCOUNTER — Encounter: Payer: Self-pay | Admitting: Physical Therapy

## 2021-10-24 DIAGNOSIS — M5459 Other low back pain: Secondary | ICD-10-CM | POA: Diagnosis not present

## 2021-10-24 DIAGNOSIS — M6281 Muscle weakness (generalized): Secondary | ICD-10-CM

## 2021-10-24 DIAGNOSIS — R262 Difficulty in walking, not elsewhere classified: Secondary | ICD-10-CM | POA: Diagnosis not present
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
## 2021-10-28 ENCOUNTER — Ambulatory Visit: Payer: 59 | Admitting: Physical Therapy

## 2021-10-28 ENCOUNTER — Encounter: Payer: Self-pay | Admitting: Physical Therapy

## 2021-10-28 DIAGNOSIS — R262 Difficulty in walking, not elsewhere classified: Secondary | ICD-10-CM | POA: Diagnosis not present

## 2021-10-28 DIAGNOSIS — M6281 Muscle weakness (generalized): Secondary | ICD-10-CM | POA: Diagnosis not present

## 2021-10-28 DIAGNOSIS — M5459 Other low back pain: Secondary | ICD-10-CM | POA: Diagnosis not present
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
## 2021-10-30 ENCOUNTER — Ambulatory Visit (INDEPENDENT_AMBULATORY_CARE_PROVIDER_SITE_OTHER): Payer: 59 | Admitting: Physical Therapy

## 2021-10-30 ENCOUNTER — Encounter: Payer: Self-pay | Admitting: Physical Therapy

## 2021-10-30 DIAGNOSIS — M5459 Other low back pain: Secondary | ICD-10-CM

## 2021-10-30 DIAGNOSIS — R262 Difficulty in walking, not elsewhere classified: Secondary | ICD-10-CM | POA: Diagnosis not present

## 2021-10-30 DIAGNOSIS — M6281 Muscle weakness (generalized): Secondary | ICD-10-CM
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
## 2021-11-01 ENCOUNTER — Other Ambulatory Visit: Payer: Self-pay | Admitting: Family Medicine

## 2021-11-01 DIAGNOSIS — E2839 Other primary ovarian failure: Secondary | ICD-10-CM

## 2021-12-10 ENCOUNTER — Ambulatory Visit
Admission: RE | Admit: 2021-12-10 | Discharge: 2021-12-10 | Disposition: A | Discharge status: Home / Self Care | Payer: 59 | Source: Ambulatory Visit | Attending: Family Medicine | Admitting: Family Medicine

## 2021-12-10 DIAGNOSIS — E2839 Other primary ovarian failure: Secondary | ICD-10-CM

## 2022-07-25 IMAGING — RF DG FEMUR 2+V*R*
1 series · 6 of 6 positions shown · non-contrast
Comparison: Radiograph 04/17/2020

FLUOROSCOPY TIME:  1 minutes 55 seconds

13.65 mGy

Six static images

CLINICAL DATA: Surgery, intramedullary nail placement

EXAM:
RIGHT FEMUR 2 VIEWS; DG C-ARM 1-60 MIN

[Series 1: run · 6 of 6 slices shown]
[im 1/6]
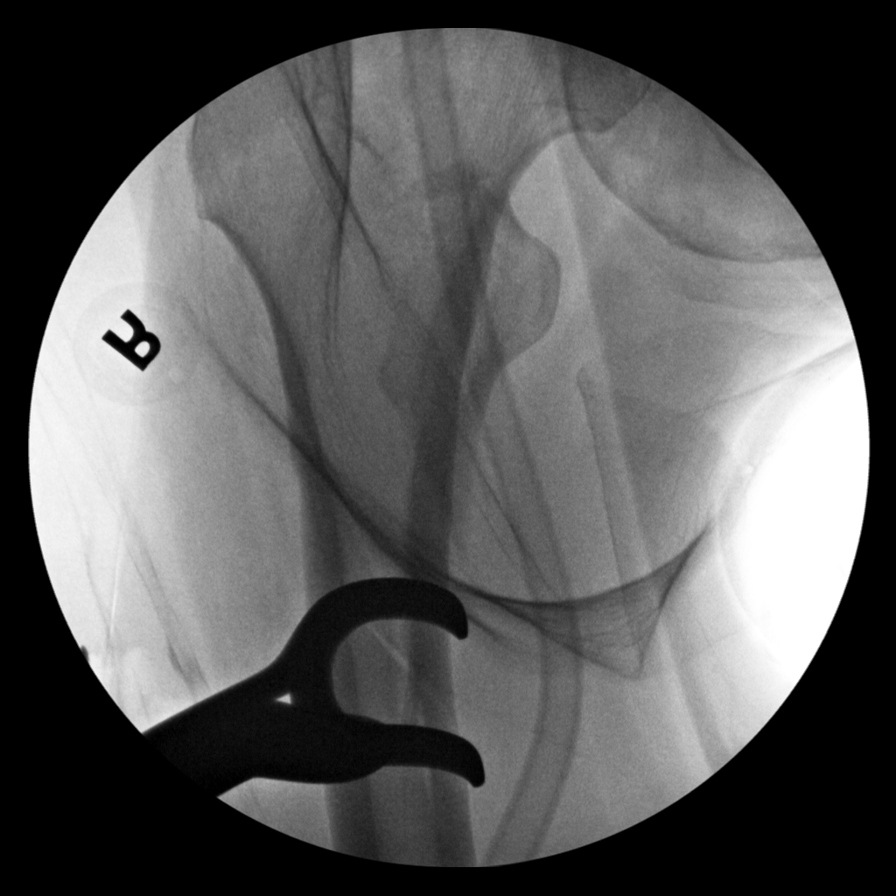
[im 2/6]
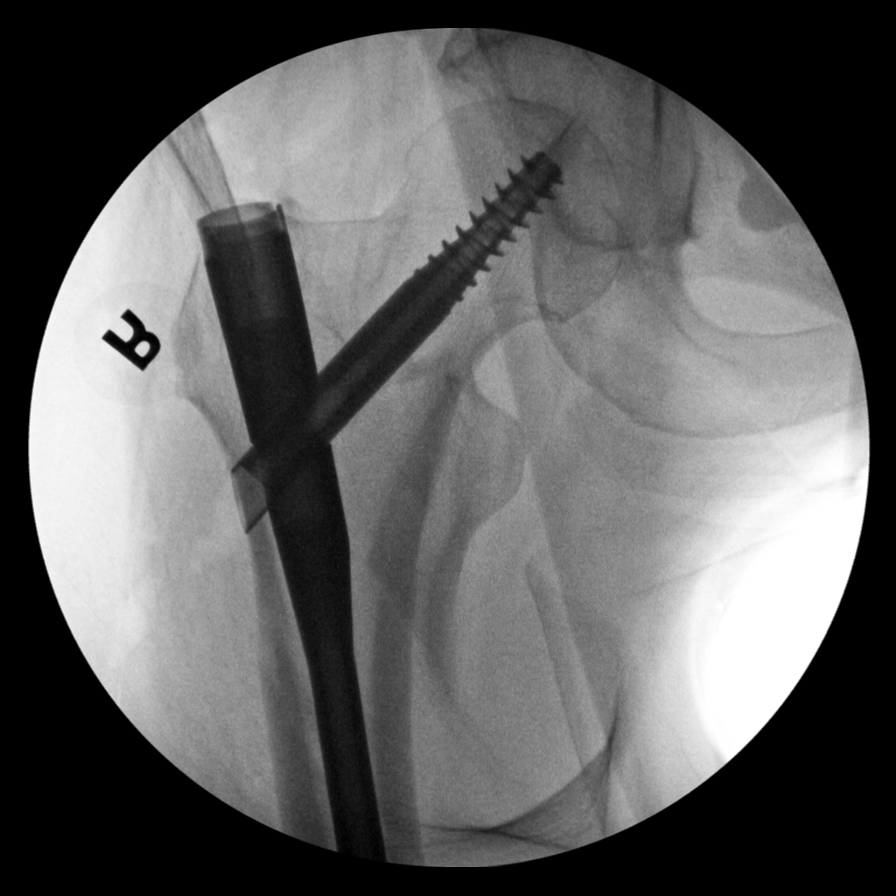
[im 3/6]
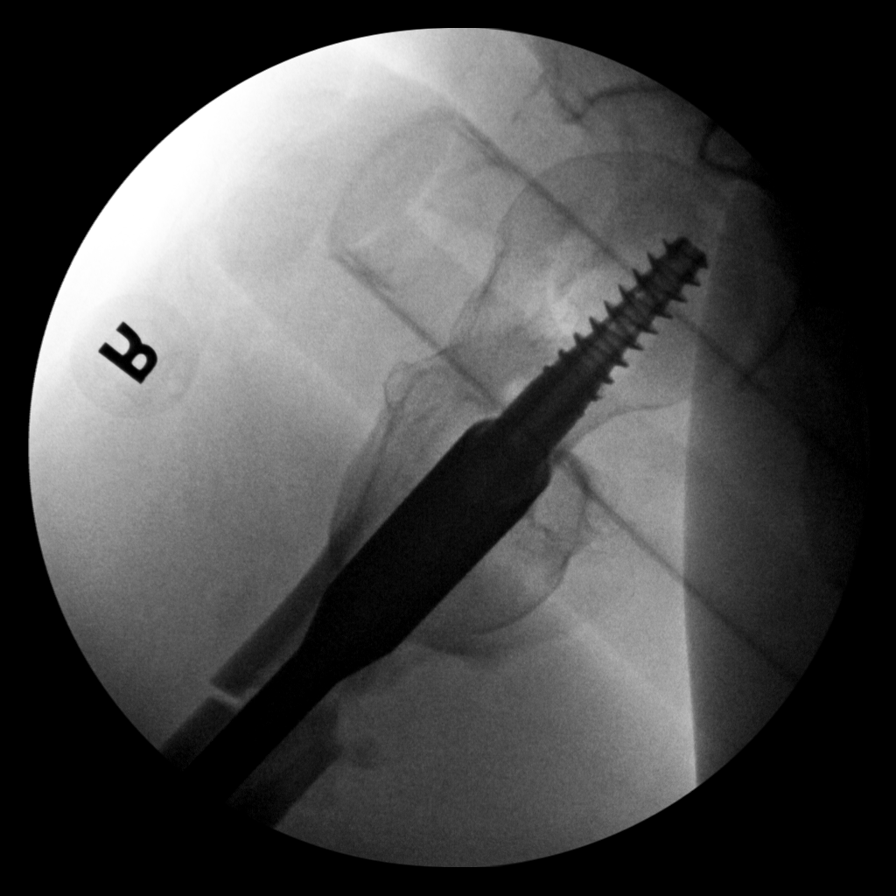
[im 4/6]
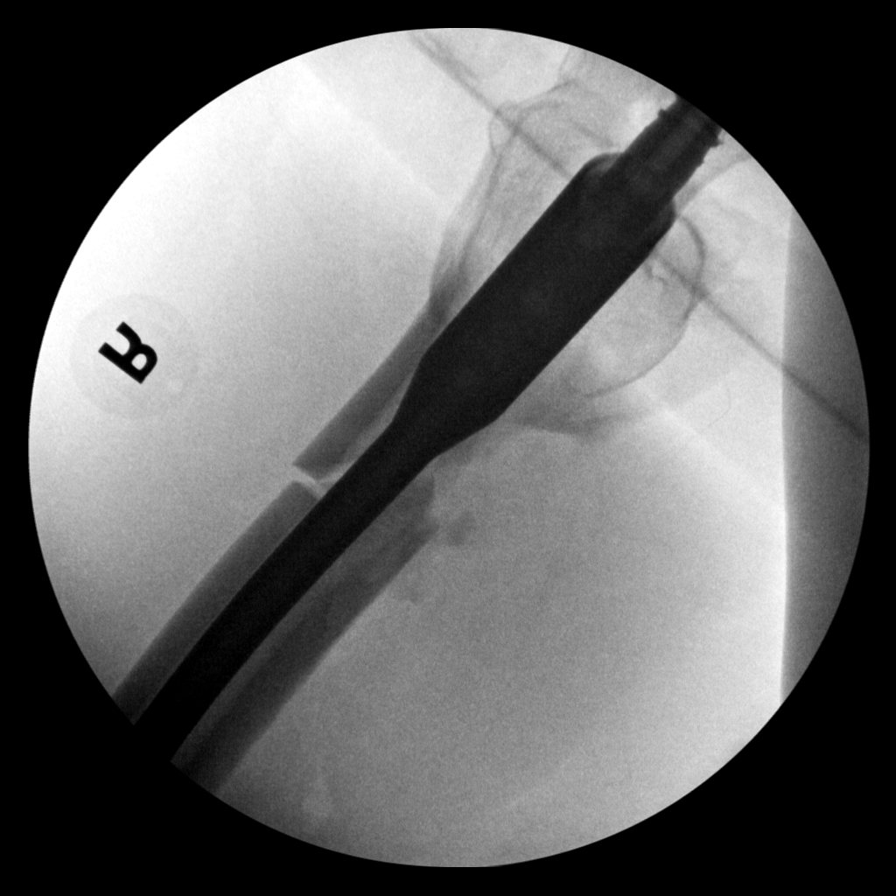
[im 5/6]
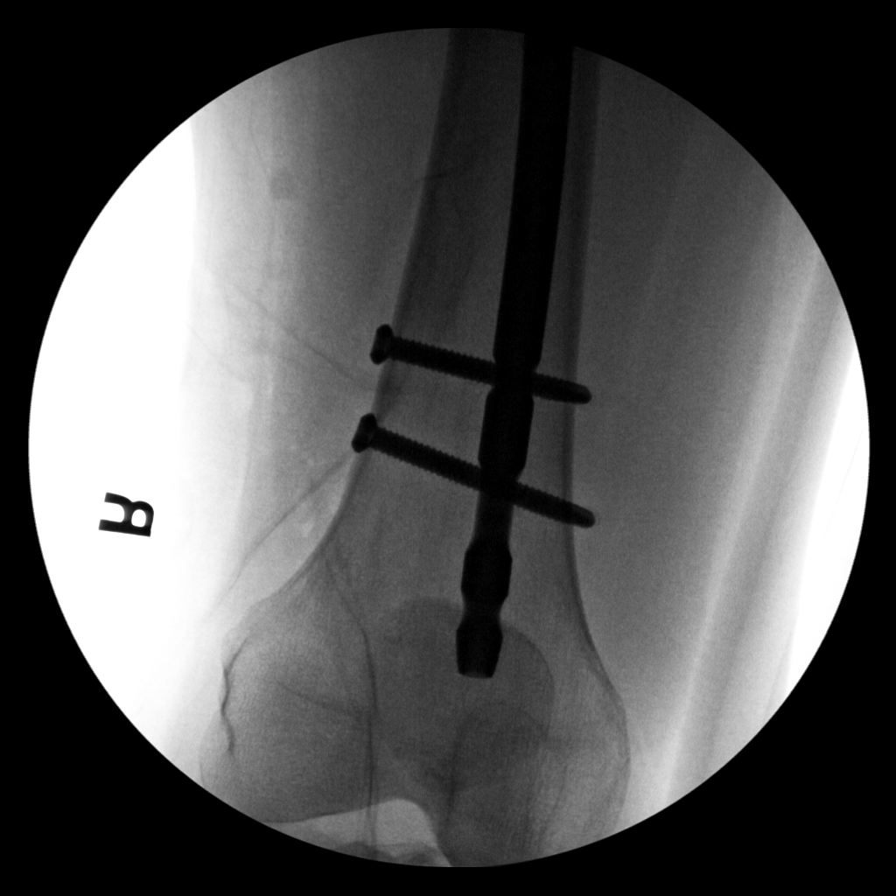
[im 6/6]
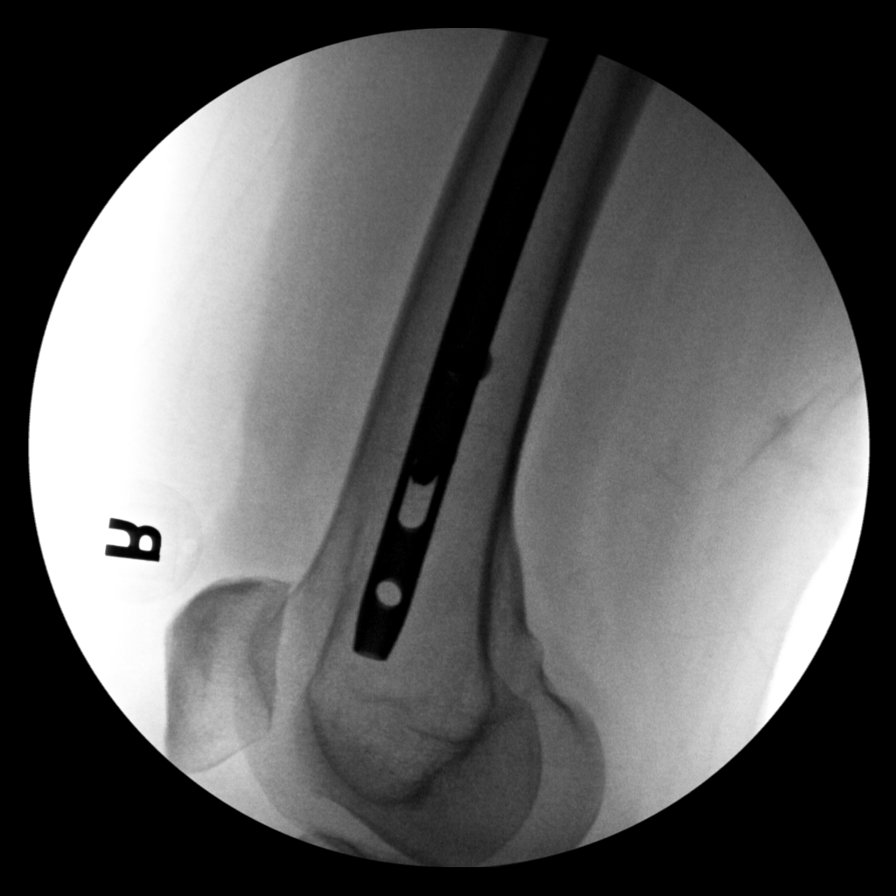

[6 of 6 positions shown; findings below may reference images not displayed]

FINDINGS: Sequential images redemonstrated comminuted fracture of the proximal
femur with intertrochanteric extension. Subsequent placement of a
right femoral intramedullary nail and partially threaded
transcervical fixation pin. No acute complication is evident. Slight
residual medial displacement of the fracture fragment comprising the
lesser trochanter. No acute complication is seen.
IMPRESSION: Interval placement of a right femoral intramedullary nail and
transcervical fixation pin without acute complication. Correlate
with operative report.
